# Patient Record
Sex: Female | Born: 1935
Health system: Southern US, Community
[De-identification: ages and names within clinical notes are randomized; demographics above are authoritative.]

## PROBLEM LIST (undated history)

## (undated) DIAGNOSIS — N179 Acute kidney failure, unspecified: Secondary | ICD-10-CM

## (undated) DIAGNOSIS — F0390 Unspecified dementia without behavioral disturbance: Secondary | ICD-10-CM

## (undated) DIAGNOSIS — I10 Essential (primary) hypertension: Secondary | ICD-10-CM

## (undated) DIAGNOSIS — E039 Hypothyroidism, unspecified: Secondary | ICD-10-CM

## (undated) DIAGNOSIS — E119 Type 2 diabetes mellitus without complications: Secondary | ICD-10-CM

## (undated) DIAGNOSIS — E079 Disorder of thyroid, unspecified: Secondary | ICD-10-CM

## (undated) HISTORY — DX: Essential (primary) hypertension: I10

## (undated) HISTORY — PX: ROTATOR CUFF REPAIR: SHX139

## (undated) HISTORY — DX: Acute kidney failure, unspecified: N17.9

## (undated) HISTORY — DX: Unspecified dementia, unspecified severity, without behavioral disturbance, psychotic disturbance, mood disturbance, and anxiety: F03.90

---

## 1999-09-11 ENCOUNTER — Encounter: Admission: RE | Admit: 1999-09-11 | Discharge: 1999-09-11 | Payer: Self-pay | Admitting: *Deleted

## 2000-09-12 ENCOUNTER — Encounter: Payer: Self-pay | Admitting: Internal Medicine

## 2000-09-12 ENCOUNTER — Encounter: Admission: RE | Admit: 2000-09-12 | Discharge: 2000-09-12 | Payer: Self-pay | Admitting: Internal Medicine

## 2000-10-15 ENCOUNTER — Other Ambulatory Visit: Admission: RE | Admit: 2000-10-15 | Discharge: 2000-10-15 | Payer: Self-pay | Admitting: Internal Medicine

## 2000-12-11 ENCOUNTER — Encounter: Admission: RE | Admit: 2000-12-11 | Discharge: 2000-12-11 | Payer: Self-pay

## 2001-05-20 ENCOUNTER — Encounter: Admission: RE | Admit: 2001-05-20 | Discharge: 2001-05-20 | Payer: Self-pay

## 2001-09-16 ENCOUNTER — Encounter: Admission: RE | Admit: 2001-09-16 | Discharge: 2001-09-16 | Payer: Self-pay

## 2001-10-08 ENCOUNTER — Encounter: Admission: RE | Admit: 2001-10-08 | Discharge: 2001-10-08 | Payer: Self-pay

## 2001-10-22 ENCOUNTER — Encounter: Admission: RE | Admit: 2001-10-22 | Discharge: 2001-10-22 | Payer: Self-pay

## 2001-11-05 ENCOUNTER — Encounter: Admission: RE | Admit: 2001-11-05 | Discharge: 2001-11-05 | Payer: Self-pay

## 2001-11-13 ENCOUNTER — Ambulatory Visit (HOSPITAL_COMMUNITY): Admission: RE | Admit: 2001-11-13 | Discharge: 2001-11-13 | Payer: Self-pay | Admitting: Obstetrics and Gynecology

## 2001-11-13 ENCOUNTER — Encounter (INDEPENDENT_AMBULATORY_CARE_PROVIDER_SITE_OTHER): Payer: Self-pay | Admitting: *Deleted

## 2002-09-16 ENCOUNTER — Encounter: Admission: RE | Admit: 2002-09-16 | Discharge: 2002-09-16 | Payer: Self-pay

## 2003-01-12 ENCOUNTER — Encounter: Payer: Self-pay | Admitting: Orthopedic Surgery

## 2003-01-14 ENCOUNTER — Observation Stay (HOSPITAL_COMMUNITY): Admission: RE | Admit: 2003-01-14 | Discharge: 2003-01-15 | Payer: Self-pay | Admitting: Orthopedic Surgery

## 2003-04-07 ENCOUNTER — Ambulatory Visit (HOSPITAL_COMMUNITY): Admission: RE | Admit: 2003-04-07 | Discharge: 2003-04-07 | Payer: Self-pay | Admitting: *Deleted

## 2003-09-20 ENCOUNTER — Encounter: Admission: RE | Admit: 2003-09-20 | Discharge: 2003-09-20 | Payer: Self-pay | Admitting: Internal Medicine

## 2003-12-09 ENCOUNTER — Other Ambulatory Visit: Admission: RE | Admit: 2003-12-09 | Discharge: 2003-12-09 | Payer: Self-pay | Admitting: Internal Medicine

## 2004-09-20 ENCOUNTER — Ambulatory Visit (HOSPITAL_COMMUNITY): Admission: RE | Admit: 2004-09-20 | Discharge: 2004-09-20 | Payer: Self-pay | Admitting: Internal Medicine

## 2005-05-08 ENCOUNTER — Encounter: Admission: RE | Admit: 2005-05-08 | Discharge: 2005-05-08 | Payer: Self-pay

## 2005-05-23 ENCOUNTER — Encounter: Admission: RE | Admit: 2005-05-23 | Discharge: 2005-05-23 | Payer: Self-pay

## 2005-06-07 ENCOUNTER — Encounter: Admission: RE | Admit: 2005-06-07 | Discharge: 2005-06-07 | Payer: Self-pay

## 2005-06-27 ENCOUNTER — Encounter: Admission: RE | Admit: 2005-06-27 | Discharge: 2005-06-27 | Payer: Self-pay

## 2005-09-26 ENCOUNTER — Ambulatory Visit (HOSPITAL_COMMUNITY): Admission: RE | Admit: 2005-09-26 | Discharge: 2005-09-26 | Payer: Self-pay | Admitting: Internal Medicine

## 2006-09-30 ENCOUNTER — Ambulatory Visit (HOSPITAL_COMMUNITY): Admission: RE | Admit: 2006-09-30 | Discharge: 2006-09-30 | Payer: Self-pay | Admitting: Internal Medicine

## 2007-07-02 ENCOUNTER — Encounter: Admission: RE | Admit: 2007-07-02 | Discharge: 2007-07-02 | Payer: Self-pay | Admitting: Internal Medicine

## 2007-10-03 ENCOUNTER — Ambulatory Visit (HOSPITAL_COMMUNITY): Admission: RE | Admit: 2007-10-03 | Discharge: 2007-10-03 | Payer: Self-pay | Admitting: Internal Medicine

## 2007-10-31 ENCOUNTER — Encounter: Admission: RE | Admit: 2007-10-31 | Discharge: 2007-10-31 | Payer: Self-pay

## 2008-10-04 ENCOUNTER — Ambulatory Visit (HOSPITAL_COMMUNITY): Admission: RE | Admit: 2008-10-04 | Discharge: 2008-10-04 | Payer: Self-pay | Admitting: Internal Medicine

## 2009-07-07 ENCOUNTER — Encounter: Admission: RE | Admit: 2009-07-07 | Discharge: 2009-07-07 | Payer: Self-pay | Admitting: Urology

## 2009-07-11 ENCOUNTER — Ambulatory Visit (HOSPITAL_BASED_OUTPATIENT_CLINIC_OR_DEPARTMENT_OTHER): Admission: RE | Admit: 2009-07-11 | Discharge: 2009-07-12 | Payer: Self-pay | Admitting: Urology

## 2009-10-05 ENCOUNTER — Ambulatory Visit (HOSPITAL_COMMUNITY): Admission: RE | Admit: 2009-10-05 | Discharge: 2009-10-05 | Payer: Self-pay | Admitting: Internal Medicine

## 2010-10-09 ENCOUNTER — Ambulatory Visit (HOSPITAL_COMMUNITY)
Admission: RE | Admit: 2010-10-09 | Discharge: 2010-10-09 | Payer: Self-pay | Source: Home / Self Care | Attending: Internal Medicine | Admitting: Internal Medicine

## 2010-12-28 LAB — BASIC METABOLIC PANEL
BUN: 29 mg/dL — ABNORMAL HIGH (ref 6–23)
CO2: 30 mEq/L (ref 19–32)
Calcium: 9.1 mg/dL (ref 8.4–10.5)
Chloride: 98 mEq/L (ref 96–112)
Creatinine, Ser: 0.97 mg/dL (ref 0.4–1.2)
GFR calc Af Amer: >60 mL/min (ref >60)
GFR calc non Af Amer: 56 mL/min — ABNORMAL LOW (ref >60)
Glucose, Bld: 78 mg/dL (ref 70–99)
Potassium: 4.1 mEq/L (ref 3.5–5.1)
Sodium: 138 mEq/L (ref 135–145)

## 2011-02-09 NOTE — H&P (Signed)
[facility]  Patient:    Brianna Carr, Brianna Carr Visit Number: [id number] MRN: [id number]          Service Type: Attending:  Juluis Mire, M.D. Dictated by:   Juluis Mire, M.D. Adm. Date:  11/13/01                           History and Physical  CHIEF COMPLAINT:              The patient is a 78 year old gravida 6 para 5 abortus 1, pleasant female who presents for hysteroscopic evaluation.  HISTORY OF PRESENT ILLNESS:   In relation to the present admission, the patient was initially presented to our practice on September 23, 2001 for evaluation of possible pelvic relaxation.  She stated she had some urinary frequency.  On evaluation it was found that she did have a cystocele and rectocele.  The uterine lining was somewhat atrophic.  She had fairly good uterine support and unremarkable bimanual examination.  Because of the pelvic relaxation we went ahead and did an ultrasound evaluation and overall uterine size was normal but she had a significant thickening of the endometrium, with an apparent polyp that was identified upon ultrasound.  In view of this the patient now presents for hysteroscopic evaluation.  Of note, she denies any abnormal bleeding.  ALLERGIES:                    1. SULFA.                               2. MACRODANTIN.  MEDICATIONS:                  1. Synthroid.                               2. Ziac.                               3. Vioxx.  PAST MEDICAL HISTORY:         1. History of hypertension.  She is under active                                  management for this.  She sees Dr. Shelva Majestic for                                  routine physicals.                               2. History of osteoarthritis, for which she sees                                  Dr. Phylliss Bob.  She has had a recent spinal                                  injection for pain associated with that.  3. Hypothyroid, on Synthroid as  noted; fairly                                  good control.  PAST SURGICAL HISTORY:        1. Previous bilateral tubal ligation.                               2. Previous carpal tunnel surgery.  PAST OBSTETRICAL HISTORY:     Five spontaneous vaginal deliveries.  SOCIAL HISTORY:               No tobacco or alcohol use.  FAMILY HISTORY:               Noncontributory.  REVIEW OF SYSTEMS:            Noncontributory.  PHYSICAL EXAMINATION:  VITAL SIGNS:                  The patient is afebrile with stable vital signs.  HEENT:                        Normocephalic.  PERRLA.  EOMI.  Sclerae and conjunctivae clear.  Oropharynx clear.  NECK:                         Without thyromegaly.  BREAST:                       No discrete masses.  LUNGS:                        Clear.  CARDIAC:                      Regular rate and rhythm without murmurs or gallops.  ABDOMEN:                      Benign.  PELVIC:                       Normal external genitalia, vaginal mucosa.  Mild to moderate cystocele and rectocele.  Uterus normal size, shape, and contour. Adnexa free of masses or tenderness.  RECTAL:                       Clear.  EXTREMITIES:                  Trace edema.  NEUROLOGIC:                   Grossly within normal limits.  IMPRESSION:                   1. Abnormal endometrial buildup.                               2. Pelvic relaxation.                               3. Hypertension.  4. Hypothyroidism.                               5. Osteoarthritis.  PLAN:                         The patient will undergo hysteroscopic evaluation for evaluation of the uterine lining.  The risks of surgery have been discussed including anesthetic concerns, the risk of infection, the rare risk of vascular injury that could require transfusion - with the risk of AIDS or hepatitis, excessive bleeding that could require hysterectomy, risk of uterine perforation  that could lead to injury to adjacent organs requiring further exploratory surgery, risk of deep vein thrombosis and pulmonary embolus.  Rare complications of hysteroscopy include fluid overload leading to pulmonary edema or hyponatremia.  The patient expressed understanding of indications and risks. Dictated by:   Juluis Mire, M.D. Attending:  Juluis Mire, M.D. DD:  11/13/01 TD:  11/13/01 Job: [id number]

## 2011-02-09 NOTE — Op Note (Signed)
   NAMEDONNAE, MICHELS                        ACCOUNT NO.:  [id number]   MEDICAL RECORD NO.:  [id number]                   PATIENT TYPE:  AMB   LOCATION:  ENDO                                 FACILITY:  [facility]   PHYSICIAN:  Georgiana Spinner, M.D.                 DATE OF BIRTH:  [birth year]   DATE OF PROCEDURE:  04/07/2003  DATE OF DISCHARGE:                                 OPERATIVE REPORT   PROCEDURE PERFORMED:  Colonoscopy.   ENDOSCOPIST:  Georgiana Spinner, M.D.   INDICATIONS FOR PROCEDURE:  Screening for colon cancer.   ANESTHESIA:  Demerol 80 mg, Versed 7 mg.   DESCRIPTION OF PROCEDURE:  With the patient mildly sedated in the left  lateral decubitus position, the Olympus video colonoscope was inserted in  the rectum and passed under direct vision to the cecum, identified by the  crow's foot of the cecum and the ileocecal valve, both of which were  photographed.  From this point the colonoscope was slowly withdrawn taking  circumferential views of the entire colonic mucosa stopping only in the  rectum which appeared normal on direct and showed hemorrhoids on retroflex  view.  The endoscope was straightened and withdrawn.  The patient's vital  signs and pulse oximeter remained stable.  The patient tolerated the  procedure well without apparent complications.   FINDINGS:  Internal hemorrhoids.  Otherwise unremarkable examination.   PLAN:  Repeat examination possibly in five to 10 years.                                                 Georgiana Spinner, M.D.    GMO/MEDQ  D:  04/07/2003  T:  04/07/2003  Job:  [id number]

## 2011-02-09 NOTE — Op Note (Signed)
[facility]  Patient:    KRIMSON, MASSMANN Visit Number: [id number] MRN: [id number]          Service Type: Attending:  Juluis Mire, M.D. Dictated by:   Juluis Mire, M.D. Proc. Date: 11/13/01                             Operative Report  PREOPERATIVE DIAGNOSIS:       Probable endometrial polyp.  POSTOPERATIVE DIAGNOSIS:      Probable endometrial polyp.  OPERATION:                    1. Cervical dilatation.                               2. Hysteroscopy.                               3. Resection of large endometrial polyp.                               4. Multiple endometrial biopsies.                               5. Uterine curettings.  SURGEON:                      Juluis Mire, M.D.  ANESTHESIA:                   General.  ESTIMATED BLOOD LOSS:         Minimal.  PACKS AND DRAINS:             None.  INTRAOPERATIVE BLOOD REPLACEMENT:                  None.  COMPLICATIONS:                None.  INDICATIONS:                  Are notated in the History and Physical.  DESCRIPTION OF PROCEDURE:     The patient was taken to the OR and placed in the supine position.  After a satisfactory level of general anesthesia was obtained, the patient was placed in the dorsolithotomy position using Allen stirrups.  Perineum and vagina were prepped out with Betadine.  The patient was draped out for hysteroscopy.  A speculum was placed in the vaginal vault. The cervix was grasped with a single tooth tenaculum.  The uterus sounded to approximately 8 cm.  The cervix was serially dilated to a size 35 Pratt dilator.  The operative hysteroscope was then introduced.  The intrauterine cavity was distended using sorbitol.  Visualization revealed a large endometrial polyp.  This was removed with multiple resections and sent for pathological review.  We then visualized the endometrial cavity once the polyp was removed.  The _________ was hemostatically  intact.  Otherwise, the endometrium was very atrophic.  Multiple endometrial biopsies were obtained from anterior, posterior, and lateral walls.  These were all sent for pathological view. Endometrial curettings were also obtained.  These were minimal in nature. There are no signs of perforation or active bleeding.  The speculum  and single tooth tenaculum were then removed.  The patient was taken out of the dorsolithotomy position, was alert, extubated, and transferred to the recovery room in good condition.  Sponge, instrument, and needle count was reported as correct by the circulating nurse. Dictated by:   Juluis Mire, M.D. Attending:  Juluis Mire, M.D. DD:  11/13/01 TD:  11/14/01 Job: [id number]

## 2011-02-09 NOTE — Op Note (Signed)
[name], Brianna Carr                        ACCOUNT NO.:  [id number]   MEDICAL RECORD NO.:  [id number]                   PATIENT TYPE:  OBV   LOCATION:  0464                                 FACILITY:  [facility]   PHYSICIAN:  Georges Lynch. Darrelyn Carr, M.D.             DATE OF BIRTH:  1931/04/13   DATE OF PROCEDURE:  01/14/2003  DATE OF DISCHARGE:                                 OPERATIVE REPORT   SURGEON:  Georges Lynch. Darrelyn Carr, M.D.   ASSISTANT:  Ebbie Ridge. Paitsel, P.A.   PREOPERATIVE DIAGNOSES:  1. Frozen shoulder, right shoulder.  2. Severe impingement syndrome, right shoulder with a partial tear of the     rotator cuff.   POSTOPERATIVE DIAGNOSES:  1. Frozen shoulder, right shoulder.  2. Severe impingement syndrome, right shoulder with a partial tear of the     rotator cuff.   OPERATION:  1. Closed manipulation of the right shoulder under general anesthesia.  2. Open decompression of the right shoulder.  3. Excision of the subdeltoid bursa, right shoulder.  4. Exploratory laparotomy of the rotator cuff, right shoulder.   DESCRIPTION OF PROCEDURE:  Under general anesthesia, a routine orthopedic  prep and draping of the right shoulder was carried out.  At this time, the  patient underwent 1 g of IV Ancef.  An incision was made over the anterior  aspect of the right shoulder, bleeders identified and cauterized.  Following  this, I stripped the deltoid tendon from the acromion in the usual fashion  and then partially split the proximal portion of the deltoid muscle.  Note,  she had severe impingement syndrome.  I protected the cuff with the Bennett  retractor and then utilized the oscillating saw to do a partial  acromionectomy and acromioplasty utilizing the bur.  Following this, I  thoroughly irrigated out the area.  I removed the subdeltoid bursa and then  inspected the rotator cuff.  The rotator cuff did not show a frank tear that  needed repair but was markedly thinned.  I did not  feel that any repair or  graft was necessary.  I took the shoulder Thursday the entire range of  motion and noted there were no other abnormalities.  I then thoroughly  irrigated out the area, reattached the deltoid tendon and muscle in the  usual fashion, then closed the subcu with 0 Vicryl, skin with metal staples,  and she was placed in a shoulder immobilizer.  Note, prior to the surgery, I  did do a closed manipulation of her frozen shoulder.  Then I injected about  20 mL of 0.5% Marcaine with epinephrine into the shoulder joint.  That was  all done before applying the dressing, obviously.  Brianna Carr, M.D.    RAG/MEDQ  D:  01/14/2003  T:  01/14/2003  Job:  [id number]

## 2011-09-10 ENCOUNTER — Other Ambulatory Visit (HOSPITAL_COMMUNITY): Payer: Self-pay | Admitting: Internal Medicine

## 2011-09-10 DIAGNOSIS — Z1231 Encounter for screening mammogram for malignant neoplasm of breast: Secondary | ICD-10-CM

## 2011-10-15 ENCOUNTER — Ambulatory Visit (HOSPITAL_COMMUNITY)
Admission: RE | Admit: 2011-10-15 | Discharge: 2011-10-15 | Disposition: A | Discharge status: Home / Self Care | Payer: Medicare Other | Source: Ambulatory Visit | Attending: Internal Medicine | Admitting: Internal Medicine

## 2011-10-15 DIAGNOSIS — Z1231 Encounter for screening mammogram for malignant neoplasm of breast: Secondary | ICD-10-CM | POA: Insufficient documentation

## 2012-01-28 ENCOUNTER — Other Ambulatory Visit: Payer: Self-pay | Admitting: Internal Medicine

## 2012-01-28 DIAGNOSIS — M4800 Spinal stenosis, site unspecified: Secondary | ICD-10-CM

## 2012-02-01 ENCOUNTER — Other Ambulatory Visit: Payer: Self-pay

## 2012-02-01 ENCOUNTER — Ambulatory Visit
Admission: RE | Admit: 2012-02-01 | Discharge: 2012-02-01 | Disposition: A | Discharge status: Home / Self Care | Payer: Medicare Other | Source: Ambulatory Visit | Attending: Internal Medicine | Admitting: Internal Medicine

## 2012-02-01 DIAGNOSIS — M4800 Spinal stenosis, site unspecified: Secondary | ICD-10-CM

## 2012-08-19 ENCOUNTER — Other Ambulatory Visit: Payer: Self-pay | Admitting: Dermatology

## 2012-09-26 ENCOUNTER — Other Ambulatory Visit (HOSPITAL_COMMUNITY): Payer: Self-pay | Admitting: Internal Medicine

## 2012-09-26 DIAGNOSIS — Z1231 Encounter for screening mammogram for malignant neoplasm of breast: Secondary | ICD-10-CM

## 2012-10-15 ENCOUNTER — Ambulatory Visit (HOSPITAL_COMMUNITY)
Admission: RE | Admit: 2012-10-15 | Discharge: 2012-10-15 | Disposition: A | Discharge status: Home / Self Care | Payer: Medicare Other | Source: Ambulatory Visit | Attending: Internal Medicine | Admitting: Internal Medicine

## 2012-10-15 DIAGNOSIS — Z1231 Encounter for screening mammogram for malignant neoplasm of breast: Secondary | ICD-10-CM | POA: Insufficient documentation

## 2012-10-17 ENCOUNTER — Other Ambulatory Visit: Payer: Self-pay | Admitting: Internal Medicine

## 2012-10-17 DIAGNOSIS — R928 Other abnormal and inconclusive findings on diagnostic imaging of breast: Secondary | ICD-10-CM

## 2012-10-30 ENCOUNTER — Ambulatory Visit
Admission: RE | Admit: 2012-10-30 | Discharge: 2012-10-30 | Disposition: A | Discharge status: Home / Self Care | Payer: Medicare Other | Source: Ambulatory Visit | Attending: Internal Medicine | Admitting: Internal Medicine

## 2012-10-30 DIAGNOSIS — R928 Other abnormal and inconclusive findings on diagnostic imaging of breast: Secondary | ICD-10-CM

## 2013-05-06 ENCOUNTER — Other Ambulatory Visit: Payer: Self-pay | Admitting: Dermatology

## 2013-08-26 ENCOUNTER — Other Ambulatory Visit (HOSPITAL_COMMUNITY): Payer: Self-pay | Admitting: Internal Medicine

## 2013-08-26 DIAGNOSIS — Z1231 Encounter for screening mammogram for malignant neoplasm of breast: Secondary | ICD-10-CM

## 2013-10-19 ENCOUNTER — Ambulatory Visit (HOSPITAL_COMMUNITY)
Admission: RE | Admit: 2013-10-19 | Discharge: 2013-10-19 | Disposition: A | Discharge status: Home / Self Care | Payer: Medicare HMO | Source: Ambulatory Visit | Attending: Internal Medicine | Admitting: Internal Medicine

## 2013-10-19 DIAGNOSIS — Z1231 Encounter for screening mammogram for malignant neoplasm of breast: Secondary | ICD-10-CM | POA: Insufficient documentation

## 2014-10-21 ENCOUNTER — Other Ambulatory Visit (HOSPITAL_COMMUNITY): Payer: Self-pay | Admitting: Internal Medicine

## 2014-10-21 DIAGNOSIS — Z1231 Encounter for screening mammogram for malignant neoplasm of breast: Secondary | ICD-10-CM

## 2014-10-25 ENCOUNTER — Ambulatory Visit (HOSPITAL_COMMUNITY)
Admission: RE | Admit: 2014-10-25 | Discharge: 2014-10-25 | Disposition: A | Discharge status: Home / Self Care | Payer: Medicare HMO | Source: Ambulatory Visit | Attending: Internal Medicine | Admitting: Internal Medicine

## 2014-10-25 DIAGNOSIS — Z1231 Encounter for screening mammogram for malignant neoplasm of breast: Secondary | ICD-10-CM | POA: Insufficient documentation

## 2015-02-16 ENCOUNTER — Other Ambulatory Visit: Payer: Self-pay | Admitting: Internal Medicine

## 2015-02-16 DIAGNOSIS — M549 Dorsalgia, unspecified: Secondary | ICD-10-CM

## 2015-02-22 ENCOUNTER — Ambulatory Visit
Admission: RE | Admit: 2015-02-22 | Discharge: 2015-02-22 | Disposition: A | Discharge status: Home / Self Care | Payer: Medicare HMO | Source: Ambulatory Visit | Attending: Internal Medicine | Admitting: Internal Medicine

## 2015-02-22 DIAGNOSIS — M549 Dorsalgia, unspecified: Secondary | ICD-10-CM

## 2015-07-02 DIAGNOSIS — Z23 Encounter for immunization: Secondary | ICD-10-CM | POA: Diagnosis not present

## 2015-08-12 DIAGNOSIS — R7309 Other abnormal glucose: Secondary | ICD-10-CM | POA: Diagnosis not present

## 2015-08-12 DIAGNOSIS — R7989 Other specified abnormal findings of blood chemistry: Secondary | ICD-10-CM | POA: Diagnosis not present

## 2015-08-12 DIAGNOSIS — E7800 Pure hypercholesterolemia, unspecified: Secondary | ICD-10-CM | POA: Diagnosis not present

## 2015-08-17 DIAGNOSIS — I10 Essential (primary) hypertension: Secondary | ICD-10-CM | POA: Diagnosis not present

## 2015-08-17 DIAGNOSIS — E039 Hypothyroidism, unspecified: Secondary | ICD-10-CM | POA: Diagnosis not present

## 2015-08-17 DIAGNOSIS — E7800 Pure hypercholesterolemia, unspecified: Secondary | ICD-10-CM | POA: Diagnosis not present

## 2015-08-17 DIAGNOSIS — E119 Type 2 diabetes mellitus without complications: Secondary | ICD-10-CM | POA: Diagnosis not present

## 2015-09-06 DIAGNOSIS — R69 Illness, unspecified: Secondary | ICD-10-CM | POA: Diagnosis not present

## 2015-10-10 ENCOUNTER — Other Ambulatory Visit: Payer: Self-pay

## 2015-10-10 DIAGNOSIS — Z1231 Encounter for screening mammogram for malignant neoplasm of breast: Secondary | ICD-10-CM

## 2015-11-01 ENCOUNTER — Ambulatory Visit
Admission: RE | Admit: 2015-11-01 | Discharge: 2015-11-01 | Disposition: A | Discharge status: Home / Self Care | Payer: Medicare HMO | Source: Ambulatory Visit

## 2015-11-01 DIAGNOSIS — Z1231 Encounter for screening mammogram for malignant neoplasm of breast: Secondary | ICD-10-CM

## 2015-12-12 DIAGNOSIS — E039 Hypothyroidism, unspecified: Secondary | ICD-10-CM | POA: Diagnosis not present

## 2015-12-12 DIAGNOSIS — E119 Type 2 diabetes mellitus without complications: Secondary | ICD-10-CM | POA: Diagnosis not present

## 2015-12-12 DIAGNOSIS — I10 Essential (primary) hypertension: Secondary | ICD-10-CM | POA: Diagnosis not present

## 2015-12-27 DIAGNOSIS — E7800 Pure hypercholesterolemia, unspecified: Secondary | ICD-10-CM | POA: Diagnosis not present

## 2015-12-27 DIAGNOSIS — E119 Type 2 diabetes mellitus without complications: Secondary | ICD-10-CM | POA: Diagnosis not present

## 2015-12-27 DIAGNOSIS — I10 Essential (primary) hypertension: Secondary | ICD-10-CM | POA: Diagnosis not present

## 2015-12-27 DIAGNOSIS — E039 Hypothyroidism, unspecified: Secondary | ICD-10-CM | POA: Diagnosis not present

## 2016-02-17 DIAGNOSIS — L820 Inflamed seborrheic keratosis: Secondary | ICD-10-CM | POA: Diagnosis not present

## 2016-02-17 DIAGNOSIS — L570 Actinic keratosis: Secondary | ICD-10-CM | POA: Diagnosis not present

## 2016-02-17 DIAGNOSIS — I781 Nevus, non-neoplastic: Secondary | ICD-10-CM | POA: Diagnosis not present

## 2016-02-17 DIAGNOSIS — D2272 Melanocytic nevi of left lower limb, including hip: Secondary | ICD-10-CM | POA: Diagnosis not present

## 2016-02-17 DIAGNOSIS — L821 Other seborrheic keratosis: Secondary | ICD-10-CM | POA: Diagnosis not present

## 2016-02-17 DIAGNOSIS — L304 Erythema intertrigo: Secondary | ICD-10-CM | POA: Diagnosis not present

## 2016-03-07 DIAGNOSIS — R69 Illness, unspecified: Secondary | ICD-10-CM | POA: Diagnosis not present

## 2016-04-09 DIAGNOSIS — E039 Hypothyroidism, unspecified: Secondary | ICD-10-CM | POA: Diagnosis not present

## 2016-04-09 DIAGNOSIS — E119 Type 2 diabetes mellitus without complications: Secondary | ICD-10-CM | POA: Diagnosis not present

## 2016-04-09 DIAGNOSIS — I10 Essential (primary) hypertension: Secondary | ICD-10-CM | POA: Diagnosis not present

## 2016-04-18 DIAGNOSIS — E119 Type 2 diabetes mellitus without complications: Secondary | ICD-10-CM | POA: Diagnosis not present

## 2016-04-18 DIAGNOSIS — E7800 Pure hypercholesterolemia, unspecified: Secondary | ICD-10-CM | POA: Diagnosis not present

## 2016-04-18 DIAGNOSIS — E039 Hypothyroidism, unspecified: Secondary | ICD-10-CM | POA: Diagnosis not present

## 2016-04-18 DIAGNOSIS — I10 Essential (primary) hypertension: Secondary | ICD-10-CM | POA: Diagnosis not present

## 2016-07-05 DIAGNOSIS — M25522 Pain in left elbow: Secondary | ICD-10-CM | POA: Diagnosis not present

## 2016-07-12 DIAGNOSIS — E039 Hypothyroidism, unspecified: Secondary | ICD-10-CM | POA: Diagnosis not present

## 2016-07-12 DIAGNOSIS — E7800 Pure hypercholesterolemia, unspecified: Secondary | ICD-10-CM | POA: Diagnosis not present

## 2016-07-12 DIAGNOSIS — E119 Type 2 diabetes mellitus without complications: Secondary | ICD-10-CM | POA: Diagnosis not present

## 2016-07-18 DIAGNOSIS — M25522 Pain in left elbow: Secondary | ICD-10-CM | POA: Diagnosis not present

## 2016-07-18 DIAGNOSIS — G5622 Lesion of ulnar nerve, left upper limb: Secondary | ICD-10-CM | POA: Diagnosis not present

## 2016-07-18 DIAGNOSIS — M47812 Spondylosis without myelopathy or radiculopathy, cervical region: Secondary | ICD-10-CM | POA: Diagnosis not present

## 2016-07-18 DIAGNOSIS — R52 Pain, unspecified: Secondary | ICD-10-CM | POA: Diagnosis not present

## 2016-07-18 DIAGNOSIS — M79642 Pain in left hand: Secondary | ICD-10-CM | POA: Diagnosis not present

## 2016-07-24 DIAGNOSIS — G5622 Lesion of ulnar nerve, left upper limb: Secondary | ICD-10-CM | POA: Diagnosis not present

## 2016-07-24 DIAGNOSIS — G5612 Other lesions of median nerve, left upper limb: Secondary | ICD-10-CM | POA: Diagnosis not present

## 2016-07-27 DIAGNOSIS — G5602 Carpal tunnel syndrome, left upper limb: Secondary | ICD-10-CM | POA: Diagnosis not present

## 2016-07-30 DIAGNOSIS — E119 Type 2 diabetes mellitus without complications: Secondary | ICD-10-CM | POA: Diagnosis not present

## 2016-07-30 DIAGNOSIS — I10 Essential (primary) hypertension: Secondary | ICD-10-CM | POA: Diagnosis not present

## 2016-07-30 DIAGNOSIS — E039 Hypothyroidism, unspecified: Secondary | ICD-10-CM | POA: Diagnosis not present

## 2016-08-02 DIAGNOSIS — R69 Illness, unspecified: Secondary | ICD-10-CM | POA: Diagnosis not present

## 2016-08-02 DIAGNOSIS — N390 Urinary tract infection, site not specified: Secondary | ICD-10-CM | POA: Diagnosis not present

## 2016-08-09 DIAGNOSIS — Z23 Encounter for immunization: Secondary | ICD-10-CM | POA: Diagnosis not present

## 2016-08-09 DIAGNOSIS — B354 Tinea corporis: Secondary | ICD-10-CM | POA: Diagnosis not present

## 2016-08-20 DIAGNOSIS — G5602 Carpal tunnel syndrome, left upper limb: Secondary | ICD-10-CM | POA: Diagnosis not present

## 2016-08-23 DIAGNOSIS — N390 Urinary tract infection, site not specified: Secondary | ICD-10-CM | POA: Diagnosis not present

## 2016-09-04 ENCOUNTER — Ambulatory Visit (INDEPENDENT_AMBULATORY_CARE_PROVIDER_SITE_OTHER): Payer: Medicare HMO | Admitting: Orthopaedic Surgery

## 2016-09-11 DIAGNOSIS — R69 Illness, unspecified: Secondary | ICD-10-CM | POA: Diagnosis not present

## 2016-09-13 DIAGNOSIS — E039 Hypothyroidism, unspecified: Secondary | ICD-10-CM | POA: Diagnosis not present

## 2016-09-13 DIAGNOSIS — M0570 Rheumatoid arthritis with rheumatoid factor of unspecified site without organ or systems involvement: Secondary | ICD-10-CM | POA: Diagnosis not present

## 2016-09-13 DIAGNOSIS — I10 Essential (primary) hypertension: Secondary | ICD-10-CM | POA: Diagnosis not present

## 2016-09-13 DIAGNOSIS — Z0000 Encounter for general adult medical examination without abnormal findings: Secondary | ICD-10-CM | POA: Diagnosis not present

## 2016-09-13 DIAGNOSIS — Z6829 Body mass index (BMI) 29.0-29.9, adult: Secondary | ICD-10-CM | POA: Diagnosis not present

## 2016-09-13 DIAGNOSIS — E119 Type 2 diabetes mellitus without complications: Secondary | ICD-10-CM | POA: Diagnosis not present

## 2016-09-26 DIAGNOSIS — L821 Other seborrheic keratosis: Secondary | ICD-10-CM | POA: Diagnosis not present

## 2016-09-26 DIAGNOSIS — L570 Actinic keratosis: Secondary | ICD-10-CM | POA: Diagnosis not present

## 2016-09-26 DIAGNOSIS — I781 Nevus, non-neoplastic: Secondary | ICD-10-CM | POA: Diagnosis not present

## 2016-09-26 DIAGNOSIS — Z23 Encounter for immunization: Secondary | ICD-10-CM | POA: Diagnosis not present

## 2016-10-18 ENCOUNTER — Other Ambulatory Visit: Payer: Self-pay | Admitting: Internal Medicine

## 2016-10-18 DIAGNOSIS — Z1231 Encounter for screening mammogram for malignant neoplasm of breast: Secondary | ICD-10-CM

## 2016-11-27 ENCOUNTER — Ambulatory Visit
Admission: RE | Admit: 2016-11-27 | Discharge: 2016-11-27 | Disposition: A | Discharge status: Home / Self Care | Payer: Medicare HMO | Source: Ambulatory Visit | Attending: Internal Medicine | Admitting: Internal Medicine

## 2016-11-27 DIAGNOSIS — Z1231 Encounter for screening mammogram for malignant neoplasm of breast: Secondary | ICD-10-CM

## 2016-12-20 DIAGNOSIS — A499 Bacterial infection, unspecified: Secondary | ICD-10-CM | POA: Diagnosis not present

## 2016-12-20 DIAGNOSIS — N390 Urinary tract infection, site not specified: Secondary | ICD-10-CM | POA: Diagnosis not present

## 2016-12-31 DIAGNOSIS — R69 Illness, unspecified: Secondary | ICD-10-CM | POA: Diagnosis not present

## 2017-01-08 DIAGNOSIS — L570 Actinic keratosis: Secondary | ICD-10-CM | POA: Diagnosis not present

## 2017-01-30 DIAGNOSIS — E119 Type 2 diabetes mellitus without complications: Secondary | ICD-10-CM | POA: Diagnosis not present

## 2017-01-30 DIAGNOSIS — E039 Hypothyroidism, unspecified: Secondary | ICD-10-CM | POA: Diagnosis not present

## 2017-01-30 DIAGNOSIS — N390 Urinary tract infection, site not specified: Secondary | ICD-10-CM | POA: Diagnosis not present

## 2017-01-30 DIAGNOSIS — I10 Essential (primary) hypertension: Secondary | ICD-10-CM | POA: Diagnosis not present

## 2017-02-07 DIAGNOSIS — E039 Hypothyroidism, unspecified: Secondary | ICD-10-CM | POA: Diagnosis not present

## 2017-02-07 DIAGNOSIS — R040 Epistaxis: Secondary | ICD-10-CM | POA: Diagnosis not present

## 2017-02-07 DIAGNOSIS — Z0000 Encounter for general adult medical examination without abnormal findings: Secondary | ICD-10-CM | POA: Diagnosis not present

## 2017-02-07 DIAGNOSIS — I10 Essential (primary) hypertension: Secondary | ICD-10-CM | POA: Diagnosis not present

## 2017-02-15 DIAGNOSIS — B359 Dermatophytosis, unspecified: Secondary | ICD-10-CM | POA: Diagnosis not present

## 2017-02-15 DIAGNOSIS — D2272 Melanocytic nevi of left lower limb, including hip: Secondary | ICD-10-CM | POA: Diagnosis not present

## 2017-02-15 DIAGNOSIS — L570 Actinic keratosis: Secondary | ICD-10-CM | POA: Diagnosis not present

## 2017-04-03 DIAGNOSIS — B359 Dermatophytosis, unspecified: Secondary | ICD-10-CM | POA: Diagnosis not present

## 2017-04-03 DIAGNOSIS — L309 Dermatitis, unspecified: Secondary | ICD-10-CM | POA: Diagnosis not present

## 2017-05-06 DIAGNOSIS — I10 Essential (primary) hypertension: Secondary | ICD-10-CM | POA: Diagnosis not present

## 2017-05-06 DIAGNOSIS — E039 Hypothyroidism, unspecified: Secondary | ICD-10-CM | POA: Diagnosis not present

## 2017-05-06 DIAGNOSIS — E119 Type 2 diabetes mellitus without complications: Secondary | ICD-10-CM | POA: Diagnosis not present

## 2017-05-10 DIAGNOSIS — E039 Hypothyroidism, unspecified: Secondary | ICD-10-CM | POA: Diagnosis not present

## 2017-05-10 DIAGNOSIS — E119 Type 2 diabetes mellitus without complications: Secondary | ICD-10-CM | POA: Diagnosis not present

## 2017-05-10 DIAGNOSIS — I10 Essential (primary) hypertension: Secondary | ICD-10-CM | POA: Diagnosis not present

## 2017-05-22 DIAGNOSIS — L309 Dermatitis, unspecified: Secondary | ICD-10-CM | POA: Diagnosis not present

## 2017-05-22 DIAGNOSIS — L570 Actinic keratosis: Secondary | ICD-10-CM | POA: Diagnosis not present

## 2017-06-04 DIAGNOSIS — R300 Dysuria: Secondary | ICD-10-CM | POA: Diagnosis not present

## 2017-06-04 DIAGNOSIS — N390 Urinary tract infection, site not specified: Secondary | ICD-10-CM | POA: Diagnosis not present

## 2017-06-24 DIAGNOSIS — R300 Dysuria: Secondary | ICD-10-CM | POA: Diagnosis not present

## 2017-06-24 DIAGNOSIS — N390 Urinary tract infection, site not specified: Secondary | ICD-10-CM | POA: Diagnosis not present

## 2017-06-24 DIAGNOSIS — M545 Low back pain: Secondary | ICD-10-CM | POA: Diagnosis not present

## 2017-07-09 DIAGNOSIS — Z23 Encounter for immunization: Secondary | ICD-10-CM | POA: Diagnosis not present

## 2017-07-25 DIAGNOSIS — 419620001 Death: Secondary | SNOMED CT | POA: Diagnosis not present

## 2017-07-25 DEATH — deceased

## 2017-08-01 DIAGNOSIS — Z0100 Encounter for examination of eyes and vision without abnormal findings: Secondary | ICD-10-CM | POA: Diagnosis not present

## 2017-08-01 DIAGNOSIS — I10 Essential (primary) hypertension: Secondary | ICD-10-CM | POA: Diagnosis not present

## 2017-10-08 ENCOUNTER — Other Ambulatory Visit: Payer: Self-pay | Admitting: Internal Medicine

## 2017-10-08 DIAGNOSIS — Z1231 Encounter for screening mammogram for malignant neoplasm of breast: Secondary | ICD-10-CM

## 2017-10-10 DIAGNOSIS — Z87891 Personal history of nicotine dependence: Secondary | ICD-10-CM | POA: Diagnosis not present

## 2017-10-10 DIAGNOSIS — Z7984 Long term (current) use of oral hypoglycemic drugs: Secondary | ICD-10-CM | POA: Diagnosis not present

## 2017-10-10 DIAGNOSIS — I10 Essential (primary) hypertension: Secondary | ICD-10-CM | POA: Diagnosis not present

## 2017-10-10 DIAGNOSIS — E11620 Type 2 diabetes mellitus with diabetic dermatitis: Secondary | ICD-10-CM | POA: Diagnosis not present

## 2017-10-10 DIAGNOSIS — R32 Unspecified urinary incontinence: Secondary | ICD-10-CM | POA: Diagnosis not present

## 2017-10-10 DIAGNOSIS — Z791 Long term (current) use of non-steroidal anti-inflammatories (NSAID): Secondary | ICD-10-CM | POA: Diagnosis not present

## 2017-10-10 DIAGNOSIS — E039 Hypothyroidism, unspecified: Secondary | ICD-10-CM | POA: Diagnosis not present

## 2017-10-10 DIAGNOSIS — E785 Hyperlipidemia, unspecified: Secondary | ICD-10-CM | POA: Diagnosis not present

## 2017-11-05 DIAGNOSIS — E119 Type 2 diabetes mellitus without complications: Secondary | ICD-10-CM | POA: Diagnosis not present

## 2017-11-05 DIAGNOSIS — E039 Hypothyroidism, unspecified: Secondary | ICD-10-CM | POA: Diagnosis not present

## 2017-11-05 DIAGNOSIS — I10 Essential (primary) hypertension: Secondary | ICD-10-CM | POA: Diagnosis not present

## 2017-11-15 DIAGNOSIS — E7800 Pure hypercholesterolemia, unspecified: Secondary | ICD-10-CM | POA: Diagnosis not present

## 2017-11-15 DIAGNOSIS — E119 Type 2 diabetes mellitus without complications: Secondary | ICD-10-CM | POA: Diagnosis not present

## 2017-11-15 DIAGNOSIS — Z780 Asymptomatic menopausal state: Secondary | ICD-10-CM | POA: Diagnosis not present

## 2017-11-15 DIAGNOSIS — R21 Rash and other nonspecific skin eruption: Secondary | ICD-10-CM | POA: Diagnosis not present

## 2017-11-15 DIAGNOSIS — I10 Essential (primary) hypertension: Secondary | ICD-10-CM | POA: Diagnosis not present

## 2017-11-15 DIAGNOSIS — E039 Hypothyroidism, unspecified: Secondary | ICD-10-CM | POA: Diagnosis not present

## 2017-11-28 ENCOUNTER — Ambulatory Visit
Admission: RE | Admit: 2017-11-28 | Discharge: 2017-11-28 | Disposition: A | Discharge status: Home / Self Care | Payer: Medicare HMO | Source: Ambulatory Visit | Attending: Internal Medicine | Admitting: Internal Medicine

## 2017-11-28 DIAGNOSIS — Z1231 Encounter for screening mammogram for malignant neoplasm of breast: Secondary | ICD-10-CM | POA: Diagnosis not present

## 2018-02-21 DIAGNOSIS — L570 Actinic keratosis: Secondary | ICD-10-CM | POA: Diagnosis not present

## 2018-02-21 DIAGNOSIS — L821 Other seborrheic keratosis: Secondary | ICD-10-CM | POA: Diagnosis not present

## 2018-02-21 DIAGNOSIS — L304 Erythema intertrigo: Secondary | ICD-10-CM | POA: Diagnosis not present

## 2018-02-21 DIAGNOSIS — L308 Other specified dermatitis: Secondary | ICD-10-CM | POA: Diagnosis not present

## 2018-02-21 DIAGNOSIS — D2272 Melanocytic nevi of left lower limb, including hip: Secondary | ICD-10-CM | POA: Diagnosis not present

## 2018-03-03 ENCOUNTER — Emergency Department (HOSPITAL_COMMUNITY): Payer: Medicare HMO

## 2018-03-03 ENCOUNTER — Encounter (HOSPITAL_COMMUNITY): Payer: Self-pay

## 2018-03-03 ENCOUNTER — Other Ambulatory Visit: Payer: Self-pay

## 2018-03-03 ENCOUNTER — Emergency Department (HOSPITAL_COMMUNITY)
Admission: EM | Admit: 2018-03-03 | Discharge: 2018-03-03 | Disposition: A | Discharge status: Home / Self Care | Payer: Medicare HMO | Attending: Emergency Medicine | Admitting: Emergency Medicine

## 2018-03-03 DIAGNOSIS — Y92018 Other place in single-family (private) house as the place of occurrence of the external cause: Secondary | ICD-10-CM | POA: Diagnosis not present

## 2018-03-03 DIAGNOSIS — Z23 Encounter for immunization: Secondary | ICD-10-CM | POA: Diagnosis not present

## 2018-03-03 DIAGNOSIS — M79642 Pain in left hand: Secondary | ICD-10-CM | POA: Diagnosis not present

## 2018-03-03 DIAGNOSIS — S022XXA Fracture of nasal bones, initial encounter for closed fracture: Secondary | ICD-10-CM | POA: Insufficient documentation

## 2018-03-03 DIAGNOSIS — S0003XA Contusion of scalp, initial encounter: Secondary | ICD-10-CM | POA: Insufficient documentation

## 2018-03-03 DIAGNOSIS — S0121XA Laceration without foreign body of nose, initial encounter: Secondary | ICD-10-CM | POA: Diagnosis not present

## 2018-03-03 DIAGNOSIS — Y999 Unspecified external cause status: Secondary | ICD-10-CM | POA: Diagnosis not present

## 2018-03-03 DIAGNOSIS — S0993XA Unspecified injury of face, initial encounter: Secondary | ICD-10-CM | POA: Diagnosis present

## 2018-03-03 DIAGNOSIS — Y9301 Activity, walking, marching and hiking: Secondary | ICD-10-CM | POA: Insufficient documentation

## 2018-03-03 DIAGNOSIS — M25532 Pain in left wrist: Secondary | ICD-10-CM

## 2018-03-03 DIAGNOSIS — W19XXXA Unspecified fall, initial encounter: Secondary | ICD-10-CM

## 2018-03-03 DIAGNOSIS — W0110XA Fall on same level from slipping, tripping and stumbling with subsequent striking against unspecified object, initial encounter: Secondary | ICD-10-CM | POA: Diagnosis not present

## 2018-03-03 DIAGNOSIS — S6992XA Unspecified injury of left wrist, hand and finger(s), initial encounter: Secondary | ICD-10-CM | POA: Diagnosis not present

## 2018-03-03 DIAGNOSIS — S60417A Abrasion of left little finger, initial encounter: Secondary | ICD-10-CM | POA: Insufficient documentation

## 2018-03-03 HISTORY — DX: Type 2 diabetes mellitus without complications: E11.9

## 2018-03-03 HISTORY — DX: Disorder of thyroid, unspecified: E07.9

## 2018-03-03 MED ORDER — TRAMADOL HCL 50 MG PO TABS: 50.0000 mg | ORAL_TABLET | Freq: Four times a day (QID) | ORAL | 0 refills | Status: DC | PRN

## 2018-03-03 MED ORDER — TETANUS-DIPHTH-ACELL PERTUSSIS 5-2.5-18.5 LF-MCG/0.5 IM SUSP
0.5000 mL | Freq: Once | INTRAMUSCULAR | Status: AC
  Administered 2018-03-03: 0.5 mL via INTRAMUSCULAR
  Filled 2018-03-03: qty 0.5

## 2018-03-03 NOTE — ED Provider Notes (Signed)
[facility] DEPT Provider Note   CSN: [id number] Arrival date & time: 03/03/18  1308     History   Chief Complaint Chief Complaint  Patient presents with  . Fall  . Head Injury    HPI Brianna Carr is a 84 y.o. female.  HPI  Brianna Carr is an 84yo female with a history of hypothyroidism who presents to the emergency department for evaluation after falling and hitting her head earlier today.  Patient states that Brianna Carr was walking in her driveway when Brianna Carr turned around to talk to her husband Brianna Carr accidentally lost her balance and fell forward hitting her head.  This happened around 12 PM today.  Brianna Carr denies loss of consciousness or blood thinner use.  Reports that Brianna Carr now has pain over her nose and forehead.  Reports pain is moderate and feels sore. Brianna Carr has not taken any over-the-counter medications for her symptoms.  Brianna Carr also reports left wrist and hand pain as well.  Brianna Carr denies nausea/vomiting, numbness, weakness, neck pain, back pain, chest pain, shortness of breath, abdominal pain, dysuria, urinary frequency.    Past Medical History:  Diagnosis Date  . Diabetes mellitus without complication (HCC)    borderline diabetes  . Thyroid disease     There are no active problems to display for this patient.   Past Surgical History:  Procedure Laterality Date  . ROTATOR CUFF REPAIR       OB History   None      Home Medications    Prior to Admission medications   Not on File    Family History History reviewed. No pertinent family history.  Social History Social History   Tobacco Use  . Smoking status: Never Smoker  . Smokeless tobacco: Never Used  Substance Use Topics  . Alcohol use: Never    Frequency: Never  . Drug use: Never     Allergies   Patient has no known allergies.   Review of Systems Review of Systems  Constitutional: Negative for fever.  HENT: Positive for facial swelling (scalp hematoma). Negative for  nosebleeds.   Eyes: Negative for visual disturbance.  Respiratory: Negative for shortness of breath.   Cardiovascular: Negative for chest pain.  Gastrointestinal: Negative for abdominal pain, nausea and vomiting.  Genitourinary: Negative for difficulty urinating, dysuria and frequency.  Musculoskeletal: Positive for arthralgias (left wrist). Negative for back pain, gait problem and neck pain.  Skin: Positive for color change (facial ecchymosis) and wound (small laceration over nasal bridge).  Neurological: Positive for headaches (pain over her face and nose).  Psychiatric/Behavioral: Negative for behavioral problems and confusion.     Physical Exam Updated Vital Signs BP (!) 161/77 (BP Location: Left Arm)   Pulse 66   Temp 98.1 F (36.7 C) (Oral)   Resp 15   Ht 5\' 3"  (1.6 m)   Wt 54.4 kg (120 lb)   SpO2 97%   BMI 21.26 kg/m   Physical Exam  Constitutional: Brianna Carr is oriented to person, place, and time. Brianna Carr appears well-developed and well-nourished. No distress.  Sitting at bedside in no apparent distress, nontoxic-appearing.  HENT:  Head: Normocephalic and atraumatic.  Mouth/Throat: Oropharynx is clear and moist. No oropharyngeal exudate.  Periorbital bruising noted over the left eye. No periorbital tenderness. Left scalp hematoma approximately 3cm in diameter. Approximately .5cm laceration noted on the bridge of the nose with overlying tenderness. No nasal septum hematoma. No battle sign. No hemotympanum.   Eyes: Pupils are equal, round, and  reactive to light. Conjunctivae and EOM are normal. Right eye exhibits no discharge. Left eye exhibits no discharge.  Neck: Normal range of motion. Neck supple.  No midline C-spine tenderness.  Cardiovascular: Normal rate, regular rhythm and intact distal pulses.  Pulmonary/Chest: Effort normal and breath sounds normal. No stridor. No respiratory distress. Brianna Carr has no wheezes. Brianna Carr has no rales.  Abdominal: Soft. Bowel sounds are normal. There  is no tenderness.  Musculoskeletal:  No midline T-spine or L-spine tenderness.  Abrasion noted over the ulnar aspect of the left little finger.  No surrounding erythema or warmth.  Left wrist with mild tenderness over the ulnar aspect as well as on the dorsal aspect of the hand. No swelling or obvious deformity. Full wrist flexion/extension, although patient reports tenderness.  No snuffbox tenderness.  Grip strength 5/5.  Radial pulses 2+ bilaterally.  Sensation to light touch intact in radial, median and ulnar nerve.  Neurological: Brianna Carr is alert and oriented to person, place, and time. Coordination normal.  Mental Status:  Alert, oriented, thought content appropriate, able to give a coherent history. Speech fluent without evidence of aphasia. Able to follow 2 step commands without difficulty.  Cranial Nerves:  II:  Peripheral visual fields grossly normal, pupils equal, round, reactive to light III,IV, VI: ptosis not present, extra-ocular motions intact bilaterally  V,VII: smile symmetric, facial light touch sensation equal VIII: hearing grossly normal to voice  X: uvula elevates symmetrically  XI: bilateral shoulder shrug symmetric and strong XII: midline tongue extension without fassiculations Motor:  Normal tone. 5/5 in upper and lower extremities bilaterally including strong and equal grip strength and dorsiflexion/plantar flexion Sensory: Pinprick and light touch normal in all extremities.  Gait: normal gait and balance  Skin: Skin is warm and dry. Capillary refill takes less than 2 seconds. Brianna Carr is not diaphoretic.  Psychiatric: Brianna Carr has a normal mood and affect. Her behavior is normal.  Nursing note and vitals reviewed.   ED Treatments / Results  Labs (all labs ordered are listed, but only abnormal results are displayed) Labs Reviewed - No data to display  EKG None  Radiology Ct Head Wo Contrast  Result Date: 03/03/2018 CLINICAL DATA:  Fall with facial hematoma. EXAM: CT HEAD  WITHOUT CONTRAST CT MAXILLOFACIAL WITHOUT CONTRAST TECHNIQUE: Multidetector CT imaging of the head and maxillofacial structures were performed using the standard protocol without intravenous contrast. Multiplanar CT image reconstructions of the maxillofacial structures were also generated. COMPARISON:  None. FINDINGS: CT HEAD FINDINGS Brain: There is no mass, hemorrhage or extra-axial collection. The size and configuration of the ventricles and extra-axial CSF spaces are normal. There is no acute or chronic infarction. The brain parenchyma is normal. Vascular: No hyperdense vessel or unexpected vascular calcification. Skull: Left frontal scalp hematoma without skull fracture. CT MAXILLOFACIAL FINDINGS Osseous: --Complex facial fracture types: No LeFort, zygomaticomaxillary complex or nasoorbitoethmoidal fracture. --Simple fracture types: Mildly displaced and comminuted fractures of the nasal bones. --Mandible, hard palate and teeth: No acute abnormality. Orbits: The globes are intact. Normal appearance of the intra- and extraconal fat. Symmetric extraocular muscles. Sinuses: No fluid levels or advanced mucosal thickening. Soft tissues: Soft tissue swelling adjacent to the nose. Left paramedian frontal scalp hematoma. IMPRESSION: 1. No acute intracranial abnormality. Normal appearance of the brain for age. 2. Left frontal scalp hematoma without skull fracture. 3. Comminuted and mildly displaced fractures of the nasal bones. Electronically Signed   By: Ulyses Jarred M.D.   On: 03/03/2018 15:45   Dg Hand Complete Left  Result Date: 03/03/2018 CLINICAL DATA:  84 year old female status post fall in driveway with skin tear and 5th metacarpal region pain. EXAM: LEFT HAND - COMPLETE 3+ VIEW COMPARISON:  None. FINDINGS: Diffuse moderate to severe IP joint osteoarthritis throughout the hand with pronounced joint space loss and bulky osteophytosis. The MCP joints are normal for age. Mild osteoarthritis at the 1st [facility]  joint. Mild for age radiocarpal joint space loss. No fracture or dislocation identified. The left 5th metacarpal appears intact. IMPRESSION: 1.  No acute fracture or dislocation identified about the left hand. 2. Osteoarthritis, severe at the IP joints. Electronically Signed   By: Genevie Ann M.D.   On: 03/03/2018 15:33   Ct Maxillofacial Wo Contrast  Result Date: 03/03/2018 CLINICAL DATA:  Fall with facial hematoma. EXAM: CT HEAD WITHOUT CONTRAST CT MAXILLOFACIAL WITHOUT CONTRAST TECHNIQUE: Multidetector CT imaging of the head and maxillofacial structures were performed using the standard protocol without intravenous contrast. Multiplanar CT image reconstructions of the maxillofacial structures were also generated. COMPARISON:  None. FINDINGS: CT HEAD FINDINGS Brain: There is no mass, hemorrhage or extra-axial collection. The size and configuration of the ventricles and extra-axial CSF spaces are normal. There is no acute or chronic infarction. The brain parenchyma is normal. Vascular: No hyperdense vessel or unexpected vascular calcification. Skull: Left frontal scalp hematoma without skull fracture. CT MAXILLOFACIAL FINDINGS Osseous: --Complex facial fracture types: No LeFort, zygomaticomaxillary complex or nasoorbitoethmoidal fracture. --Simple fracture types: Mildly displaced and comminuted fractures of the nasal bones. --Mandible, hard palate and teeth: No acute abnormality. Orbits: The globes are intact. Normal appearance of the intra- and extraconal fat. Symmetric extraocular muscles. Sinuses: No fluid levels or advanced mucosal thickening. Soft tissues: Soft tissue swelling adjacent to the nose. Left paramedian frontal scalp hematoma. IMPRESSION: 1. No acute intracranial abnormality. Normal appearance of the brain for age. 2. Left frontal scalp hematoma without skull fracture. 3. Comminuted and mildly displaced fractures of the nasal bones. Electronically Signed   By: Ulyses Jarred M.D.   On: 03/03/2018  15:45    Procedures .Marland KitchenLaceration Repair Date/Time: 03/04/2018 8:53 AM Performed by: Glyn Ade, PA-C Authorized by: Glyn Ade, PA-C   Consent:    Consent obtained:  Verbal and emergent situation   Consent given by:  Patient   Risks discussed:  Infection, pain, poor cosmetic result, poor wound healing and retained foreign body   Alternatives discussed:  No treatment and delayed treatment Anesthesia (see MAR for exact dosages):    Anesthesia method:  None Laceration details:    Location:  Face   Face location:  Nose   Length (cm):  0.5   Depth (mm):  2 Repair type:    Repair type:  Simple Exploration:    Hemostasis achieved with:  Direct pressure   Wound exploration: wound explored through full range of motion and entire depth of wound probed and visualized     Contaminated: no   Treatment:    Area cleansed with:  Betadine   Amount of cleaning:  Standard   Irrigation solution:  Sterile saline   Irrigation volume:  50ccs   Irrigation method:  Tap Skin repair:    Repair method:  Tissue adhesive Approximation:    Approximation:  Close Post-procedure details:    Dressing:  Open (no dressing)   Patient tolerance of procedure:  Tolerated well, no immediate complications   (including critical care time)  Medications Ordered in ED Medications  Tdap (BOOSTRIX) injection 0.5 mL (0.5 mLs Intramuscular Given 03/03/18  1540)     Initial Impression / Assessment and Plan / ED Course  I have reviewed the triage vital signs and the nursing notes.  Pertinent labs & imaging results that were available during my care of the patient were reviewed by me and considered in my medical decision making (see chart for details).     CT scan of head without acute intracranial abnormality.  CT maxillofacial scan reveals comminuted and mildly displaced fractures of the nasal bones.  Left hand x-ray without acute fracture or abnormality.  Patient has a small laceration on the  bridge of her nose.  This is shallow and cleaned with Betadine and sterile saline.  Closed with Dermabond.  Her tetanus was updated in the emergency department.  Counseled her on ice over her nose and scalp hematoma.  Have discussed precautions with nasal fractures including avoiding blowing the nose.  Patient placed in wrist brace for comfort of her left wrist.  Discussed RICE protocol with family members and Tylenol for any pain that Brianna Carr may have.  Patient and her daughter at bedside agree with the above plan and voiced understanding.  Discharge home.  This was a shared visit with Dr. Wilson Singer who also saw the patient and agrees with plan and discharge home.  Final Clinical Impressions(s) / ED Diagnoses   Final diagnoses:  Fall, initial encounter  Scalp hematoma, initial encounter  Left wrist pain  Closed fracture of nasal bone, initial encounter    ED Discharge Orders    None       Bernarda Caffey 03/04/18 5809    Virgel Manifold, MD 03/04/18 1308

## 2018-03-03 NOTE — ED Triage Notes (Addendum)
Patient reports that she fell in the driveway. Patient and husband deny that she had LOC. Patient's husband states it was the way she turned when walking. Patient has a hematoma to the forehead and a laceration to the bridge of the nose and a skin tear to the left elbow, left 5th finger. Patient denies any injuries to the knees.  Patient denies blood thinners.

## 2018-03-03 NOTE — Discharge Instructions (Signed)
You have a broken nose and hematoma on her scalp.  Please apply ice pack for 15 minutes at a time at least twice a day to help with swelling and pain.   Avoid blowing your nose.  Please use wrist brace to help with your left wrist pain.  No broken bones on x-ray.  You can take Tylenol at home for pain and also apply ice and elevate the wrist when you can.  I have written a prescription for tramadol which is a pain medicine that you can take if tylenol is not cutting it.   Return to the emergency department if you have any new or concerning symptoms like worsening headache, confusion, vomiting or trouble with your vision.

## 2018-03-05 DIAGNOSIS — S022XXA Fracture of nasal bones, initial encounter for closed fracture: Secondary | ICD-10-CM | POA: Diagnosis not present

## 2018-03-05 DIAGNOSIS — W19XXXA Unspecified fall, initial encounter: Secondary | ICD-10-CM | POA: Diagnosis not present

## 2018-03-05 DIAGNOSIS — S6992XA Unspecified injury of left wrist, hand and finger(s), initial encounter: Secondary | ICD-10-CM | POA: Diagnosis not present

## 2018-05-15 DIAGNOSIS — E039 Hypothyroidism, unspecified: Secondary | ICD-10-CM | POA: Diagnosis not present

## 2018-05-15 DIAGNOSIS — E119 Type 2 diabetes mellitus without complications: Secondary | ICD-10-CM | POA: Diagnosis not present

## 2018-05-15 DIAGNOSIS — I10 Essential (primary) hypertension: Secondary | ICD-10-CM | POA: Diagnosis not present

## 2018-05-16 DIAGNOSIS — R300 Dysuria: Secondary | ICD-10-CM | POA: Diagnosis not present

## 2018-05-16 DIAGNOSIS — N390 Urinary tract infection, site not specified: Secondary | ICD-10-CM | POA: Diagnosis not present

## 2018-05-22 DIAGNOSIS — E119 Type 2 diabetes mellitus without complications: Secondary | ICD-10-CM | POA: Diagnosis not present

## 2018-05-22 DIAGNOSIS — R21 Rash and other nonspecific skin eruption: Secondary | ICD-10-CM | POA: Diagnosis not present

## 2018-05-22 DIAGNOSIS — M79642 Pain in left hand: Secondary | ICD-10-CM | POA: Diagnosis not present

## 2018-05-22 DIAGNOSIS — E039 Hypothyroidism, unspecified: Secondary | ICD-10-CM | POA: Diagnosis not present

## 2018-05-22 DIAGNOSIS — I10 Essential (primary) hypertension: Secondary | ICD-10-CM | POA: Diagnosis not present

## 2018-05-22 DIAGNOSIS — H6120 Impacted cerumen, unspecified ear: Secondary | ICD-10-CM | POA: Diagnosis not present

## 2018-05-22 DIAGNOSIS — Z780 Asymptomatic menopausal state: Secondary | ICD-10-CM | POA: Diagnosis not present

## 2018-05-22 DIAGNOSIS — Z23 Encounter for immunization: Secondary | ICD-10-CM | POA: Diagnosis not present

## 2018-05-22 DIAGNOSIS — E7800 Pure hypercholesterolemia, unspecified: Secondary | ICD-10-CM | POA: Diagnosis not present

## 2018-10-23 ENCOUNTER — Other Ambulatory Visit: Payer: Self-pay | Admitting: Internal Medicine

## 2018-10-23 DIAGNOSIS — Z1231 Encounter for screening mammogram for malignant neoplasm of breast: Secondary | ICD-10-CM

## 2018-10-29 DIAGNOSIS — E7800 Pure hypercholesterolemia, unspecified: Secondary | ICD-10-CM | POA: Diagnosis not present

## 2018-10-29 DIAGNOSIS — R21 Rash and other nonspecific skin eruption: Secondary | ICD-10-CM | POA: Diagnosis not present

## 2018-10-29 DIAGNOSIS — E039 Hypothyroidism, unspecified: Secondary | ICD-10-CM | POA: Diagnosis not present

## 2018-10-29 DIAGNOSIS — E119 Type 2 diabetes mellitus without complications: Secondary | ICD-10-CM | POA: Diagnosis not present

## 2018-12-01 ENCOUNTER — Ambulatory Visit
Admission: RE | Admit: 2018-12-01 | Discharge: 2018-12-01 | Disposition: A | Discharge status: Home / Self Care | Payer: Medicare HMO | Source: Ambulatory Visit | Attending: Internal Medicine | Admitting: Internal Medicine

## 2018-12-01 DIAGNOSIS — Z1231 Encounter for screening mammogram for malignant neoplasm of breast: Secondary | ICD-10-CM

## 2019-04-01 DIAGNOSIS — R300 Dysuria: Secondary | ICD-10-CM | POA: Diagnosis not present

## 2019-04-01 DIAGNOSIS — N390 Urinary tract infection, site not specified: Secondary | ICD-10-CM | POA: Diagnosis not present

## 2019-04-22 DIAGNOSIS — E7800 Pure hypercholesterolemia, unspecified: Secondary | ICD-10-CM | POA: Diagnosis not present

## 2019-04-22 DIAGNOSIS — E119 Type 2 diabetes mellitus without complications: Secondary | ICD-10-CM | POA: Diagnosis not present

## 2019-04-22 DIAGNOSIS — E039 Hypothyroidism, unspecified: Secondary | ICD-10-CM | POA: Diagnosis not present

## 2019-04-29 DIAGNOSIS — E119 Type 2 diabetes mellitus without complications: Secondary | ICD-10-CM | POA: Diagnosis not present

## 2019-04-29 DIAGNOSIS — E7800 Pure hypercholesterolemia, unspecified: Secondary | ICD-10-CM | POA: Diagnosis not present

## 2019-04-29 DIAGNOSIS — E039 Hypothyroidism, unspecified: Secondary | ICD-10-CM | POA: Diagnosis not present

## 2019-04-29 DIAGNOSIS — I10 Essential (primary) hypertension: Secondary | ICD-10-CM | POA: Diagnosis not present

## 2019-07-14 DIAGNOSIS — R69 Illness, unspecified: Secondary | ICD-10-CM | POA: Diagnosis not present

## 2019-07-23 DIAGNOSIS — E039 Hypothyroidism, unspecified: Secondary | ICD-10-CM | POA: Diagnosis not present

## 2019-07-23 DIAGNOSIS — E119 Type 2 diabetes mellitus without complications: Secondary | ICD-10-CM | POA: Diagnosis not present

## 2019-07-23 DIAGNOSIS — I10 Essential (primary) hypertension: Secondary | ICD-10-CM | POA: Diagnosis not present

## 2019-08-03 DIAGNOSIS — M25552 Pain in left hip: Secondary | ICD-10-CM | POA: Diagnosis not present

## 2019-08-03 DIAGNOSIS — S79912A Unspecified injury of left hip, initial encounter: Secondary | ICD-10-CM | POA: Diagnosis not present

## 2019-08-03 DIAGNOSIS — E039 Hypothyroidism, unspecified: Secondary | ICD-10-CM | POA: Diagnosis not present

## 2019-08-03 DIAGNOSIS — I10 Essential (primary) hypertension: Secondary | ICD-10-CM | POA: Diagnosis not present

## 2019-08-03 DIAGNOSIS — E119 Type 2 diabetes mellitus without complications: Secondary | ICD-10-CM | POA: Diagnosis not present

## 2019-11-15 ENCOUNTER — Ambulatory Visit: Payer: Medicare Other | Attending: Internal Medicine

## 2019-11-15 DIAGNOSIS — Z23 Encounter for immunization: Secondary | ICD-10-CM | POA: Insufficient documentation

## 2019-11-15 NOTE — Progress Notes (Signed)
   Covid-19 Vaccination Clinic  Name:  Brianna Carr    MRN: [id number] DOB: [birth year]  11/15/2019  Brianna Carr was observed post Covid-19 immunization for 15 minutes without incidence. She was provided with Vaccine Information Sheet and instruction to access the V-Safe system.   Brianna Carr was instructed to call 911 with any severe reactions post vaccine: Marland Kitchen Difficulty breathing  . Swelling of your face and throat  . A fast heartbeat  . A bad rash all over your body  . Dizziness and weakness    Immunizations Administered    Name Date Dose VIS Date Route   Pfizer COVID-19 Vaccine 11/15/2019  1:57 PM 0.3 mL 09/04/2019 Intramuscular   Manufacturer: [place]   Lot: [id number]   [place]: [id number]

## 2019-12-09 ENCOUNTER — Ambulatory Visit: Payer: Medicare Other | Attending: Internal Medicine

## 2019-12-09 DIAGNOSIS — Z23 Encounter for immunization: Secondary | ICD-10-CM

## 2019-12-09 NOTE — Progress Notes (Signed)
   Covid-19 Vaccination Clinic  Name:  NAPHTALI BERRINGER    MRN: [id number] DOB: 03/11/1931  12/09/2019  Ms. Cade was observed post Covid-19 immunization for 15 minutes without incident. She was provided with Vaccine Information Sheet and instruction to access the V-Safe system.   Ms. Tatro was instructed to call 911 with any severe reactions post vaccine: Marland Kitchen Difficulty breathing  . Swelling of face and throat  . A fast heartbeat  . A bad rash all over body  . Dizziness and weakness   Immunizations Administered    Name Date Dose VIS Date Route   Pfizer COVID-19 Vaccine 12/09/2019 11:28 AM 0.3 mL 09/04/2019 Intramuscular   Manufacturer: [place]   Lot: [id number]   [place]: [id number]

## 2020-02-08 ENCOUNTER — Other Ambulatory Visit: Payer: Self-pay | Admitting: Internal Medicine

## 2020-02-08 DIAGNOSIS — Z1231 Encounter for screening mammogram for malignant neoplasm of breast: Secondary | ICD-10-CM

## 2020-02-11 ENCOUNTER — Other Ambulatory Visit: Payer: Self-pay

## 2020-02-11 ENCOUNTER — Ambulatory Visit
Admission: RE | Admit: 2020-02-11 | Discharge: 2020-02-11 | Disposition: A | Discharge status: Home / Self Care | Payer: Medicare Other | Source: Ambulatory Visit | Attending: Internal Medicine | Admitting: Internal Medicine

## 2020-02-11 DIAGNOSIS — Z1231 Encounter for screening mammogram for malignant neoplasm of breast: Secondary | ICD-10-CM

## 2020-06-21 ENCOUNTER — Emergency Department (HOSPITAL_COMMUNITY)
Admission: EM | Admit: 2020-06-21 | Discharge: 2020-06-21 | Disposition: A | Discharge status: Home / Self Care | Payer: Medicare Other | Attending: Emergency Medicine | Admitting: Emergency Medicine

## 2020-06-21 ENCOUNTER — Other Ambulatory Visit: Payer: Self-pay

## 2020-06-21 ENCOUNTER — Encounter (HOSPITAL_COMMUNITY): Payer: Self-pay | Admitting: Pediatrics

## 2020-06-21 ENCOUNTER — Emergency Department (HOSPITAL_COMMUNITY): Payer: Medicare Other

## 2020-06-21 DIAGNOSIS — R4781 Slurred speech: Secondary | ICD-10-CM | POA: Insufficient documentation

## 2020-06-21 DIAGNOSIS — R29810 Facial weakness: Secondary | ICD-10-CM | POA: Insufficient documentation

## 2020-06-21 DIAGNOSIS — R4701 Aphasia: Secondary | ICD-10-CM | POA: Diagnosis present

## 2020-06-21 DIAGNOSIS — Z5321 Procedure and treatment not carried out due to patient leaving prior to being seen by health care provider: Secondary | ICD-10-CM | POA: Diagnosis not present

## 2020-06-21 DIAGNOSIS — R410 Disorientation, unspecified: Secondary | ICD-10-CM | POA: Insufficient documentation

## 2020-06-21 LAB — DIFFERENTIAL
Abs Immature Granulocytes: 0.02 10*3/uL (ref 0.00–0.07)
Basophils Absolute: 0.1 10*3/uL (ref 0.0–0.1)
Basophils Relative: 1 %
Eosinophils Absolute: 0.4 10*3/uL (ref 0.0–0.5)
Eosinophils Relative: 7 %
Immature Granulocytes: 0 %
Lymphocytes Relative: 21 %
Lymphs Abs: 1.4 10*3/uL (ref 0.7–4.0)
Monocytes Absolute: 0.7 10*3/uL (ref 0.1–1.0)
Monocytes Relative: 11 %
Neutro Abs: 4.1 10*3/uL (ref 1.7–7.7)
Neutrophils Relative %: 60 %

## 2020-06-21 LAB — I-STAT CHEM 8, ED
BUN: 28 mg/dL — ABNORMAL HIGH (ref 8–23)
Calcium, Ion: 1.2 mmol/L (ref 1.15–1.40)
Chloride: 104 mmol/L (ref 98–111)
Creatinine, Ser: 1.1 mg/dL — ABNORMAL HIGH (ref 0.44–1.00)
Glucose, Bld: 137 mg/dL — ABNORMAL HIGH (ref 70–99)
HCT: 38 % (ref 36.0–46.0)
Hemoglobin: 12.9 g/dL (ref 12.0–15.0)
Potassium: 3.6 mmol/L (ref 3.5–5.1)
Sodium: 143 mmol/L (ref 135–145)
TCO2: 23 mmol/L (ref 22–32)

## 2020-06-21 LAB — COMPREHENSIVE METABOLIC PANEL
ALT: 20 U/L (ref 0–44)
AST: 30 U/L (ref 15–41)
Albumin: 3.9 g/dL (ref 3.5–5.0)
Alkaline Phosphatase: 63 U/L (ref 38–126)
Anion gap: 10 (ref 5–15)
BUN: 26 mg/dL — ABNORMAL HIGH (ref 8–23)
CO2: 24 mmol/L (ref 22–32)
Calcium: 9.5 mg/dL (ref 8.9–10.3)
Chloride: 106 mmol/L (ref 98–111)
Creatinine, Ser: 1.1 mg/dL — ABNORMAL HIGH (ref 0.44–1.00)
GFR calc Af Amer: 52 mL/min — ABNORMAL LOW (ref >60)
GFR calc non Af Amer: 45 mL/min — ABNORMAL LOW (ref >60)
Glucose, Bld: 147 mg/dL — ABNORMAL HIGH (ref 70–99)
Potassium: 3.7 mmol/L (ref 3.5–5.1)
Sodium: 140 mmol/L (ref 135–145)
Total Bilirubin: 0.7 mg/dL (ref 0.3–1.2)
Total Protein: 6.5 g/dL (ref 6.5–8.1)

## 2020-06-21 LAB — CBC
HCT: 41 % (ref 36.0–46.0)
Hemoglobin: 13.1 g/dL (ref 12.0–15.0)
MCH: 28.9 pg (ref 26.0–34.0)
MCHC: 32 g/dL (ref 30.0–36.0)
MCV: 90.3 fL (ref 80.0–100.0)
Platelets: 164 10*3/uL (ref 150–400)
RBC: 4.54 MIL/uL (ref 3.87–5.11)
RDW: 13.1 % (ref 11.5–15.5)
WBC: 6.8 10*3/uL (ref 4.0–10.5)
nRBC: 0 % (ref 0.0–0.2)

## 2020-06-21 LAB — APTT: aPTT: 31 seconds (ref 24–36)

## 2020-06-21 LAB — PROTIME-INR
INR: 1.1 (ref 0.8–1.2)
Prothrombin Time: 13.6 seconds (ref 11.4–15.2)

## 2020-06-21 MED ORDER — SODIUM CHLORIDE 0.9% FLUSH
3.0000 mL | Freq: Once | INTRAVENOUS | Status: DC
Start: 2020-06-21 — End: 2020-06-22

## 2020-06-21 NOTE — ED Notes (Signed)
pts daughter stated she was taking her home due to long wait times.

## 2020-06-21 NOTE — ED Triage Notes (Signed)
Reported slurred speech and facial droop started approx around 1530; resolved upon arrival to ED; patient stated she's ben having some confusion too and unable to recall events/.

## 2021-12-03 ENCOUNTER — Emergency Department (HOSPITAL_COMMUNITY): Payer: Medicare Other

## 2021-12-03 ENCOUNTER — Encounter (HOSPITAL_COMMUNITY): Payer: Self-pay | Admitting: Internal Medicine

## 2021-12-03 ENCOUNTER — Other Ambulatory Visit: Payer: Self-pay

## 2021-12-03 ENCOUNTER — Inpatient Hospital Stay (HOSPITAL_COMMUNITY)
Admission: EM | Admit: 2021-12-03 | Discharge: 2021-12-07 | DRG: 178 | Disposition: A | Discharge status: Home Health | Payer: Medicare Other | Attending: Internal Medicine | Admitting: Internal Medicine

## 2021-12-03 DIAGNOSIS — Z881 Allergy status to other antibiotic agents status: Secondary | ICD-10-CM | POA: Diagnosis not present

## 2021-12-03 DIAGNOSIS — M6282 Rhabdomyolysis: Secondary | ICD-10-CM | POA: Diagnosis present

## 2021-12-03 DIAGNOSIS — E119 Type 2 diabetes mellitus without complications: Secondary | ICD-10-CM | POA: Diagnosis not present

## 2021-12-03 DIAGNOSIS — E440 Moderate protein-calorie malnutrition: Secondary | ICD-10-CM | POA: Diagnosis present

## 2021-12-03 DIAGNOSIS — N179 Acute kidney failure, unspecified: Secondary | ICD-10-CM | POA: Diagnosis present

## 2021-12-03 DIAGNOSIS — U071 COVID-19: Principal | ICD-10-CM | POA: Diagnosis present

## 2021-12-03 DIAGNOSIS — I129 Hypertensive chronic kidney disease with stage 1 through stage 4 chronic kidney disease, or unspecified chronic kidney disease: Secondary | ICD-10-CM | POA: Diagnosis present

## 2021-12-03 DIAGNOSIS — F0390 Unspecified dementia without behavioral disturbance: Secondary | ICD-10-CM | POA: Diagnosis present

## 2021-12-03 DIAGNOSIS — N1832 Chronic kidney disease, stage 3b: Secondary | ICD-10-CM | POA: Diagnosis present

## 2021-12-03 DIAGNOSIS — B9620 Unspecified Escherichia coli [E. coli] as the cause of diseases classified elsewhere: Secondary | ICD-10-CM | POA: Diagnosis present

## 2021-12-03 DIAGNOSIS — Z79890 Hormone replacement therapy (postmenopausal): Secondary | ICD-10-CM

## 2021-12-03 DIAGNOSIS — E1122 Type 2 diabetes mellitus with diabetic chronic kidney disease: Secondary | ICD-10-CM | POA: Diagnosis present

## 2021-12-03 DIAGNOSIS — N390 Urinary tract infection, site not specified: Secondary | ICD-10-CM | POA: Diagnosis present

## 2021-12-03 DIAGNOSIS — I10 Essential (primary) hypertension: Secondary | ICD-10-CM | POA: Diagnosis present

## 2021-12-03 DIAGNOSIS — Z79899 Other long term (current) drug therapy: Secondary | ICD-10-CM

## 2021-12-03 DIAGNOSIS — Z7984 Long term (current) use of oral hypoglycemic drugs: Secondary | ICD-10-CM

## 2021-12-03 DIAGNOSIS — E039 Hypothyroidism, unspecified: Secondary | ICD-10-CM | POA: Diagnosis present

## 2021-12-03 DIAGNOSIS — E871 Hypo-osmolality and hyponatremia: Secondary | ICD-10-CM | POA: Diagnosis present

## 2021-12-03 DIAGNOSIS — F05 Delirium due to known physiological condition: Secondary | ICD-10-CM | POA: Diagnosis present

## 2021-12-03 DIAGNOSIS — E785 Hyperlipidemia, unspecified: Secondary | ICD-10-CM | POA: Diagnosis present

## 2021-12-03 DIAGNOSIS — W19XXXA Unspecified fall, initial encounter: Principal | ICD-10-CM

## 2021-12-03 HISTORY — DX: Hypothyroidism, unspecified: E03.9

## 2021-12-03 LAB — CBC WITH DIFFERENTIAL/PLATELET
Abs Immature Granulocytes: 0.12 10*3/uL — ABNORMAL HIGH (ref 0.00–0.07)
Basophils Absolute: 0 10*3/uL (ref 0.0–0.1)
Basophils Relative: 0 %
Eosinophils Absolute: 0 10*3/uL (ref 0.0–0.5)
Eosinophils Relative: 0 %
HCT: 43.7 % (ref 36.0–46.0)
Hemoglobin: 14.4 g/dL (ref 12.0–15.0)
Immature Granulocytes: 1 %
Lymphocytes Relative: 8 %
Lymphs Abs: 0.8 10*3/uL (ref 0.7–4.0)
MCH: 29.2 pg (ref 26.0–34.0)
MCHC: 33 g/dL (ref 30.0–36.0)
MCV: 88.6 fL (ref 80.0–100.0)
Monocytes Absolute: 1 10*3/uL (ref 0.1–1.0)
Monocytes Relative: 10 %
Neutro Abs: 7.7 10*3/uL (ref 1.7–7.7)
Neutrophils Relative %: 81 %
Platelets: 240 10*3/uL (ref 150–400)
RBC: 4.93 MIL/uL (ref 3.87–5.11)
RDW: 13.3 % (ref 11.5–15.5)
WBC: 9.6 10*3/uL (ref 4.0–10.5)
nRBC: 0 % (ref 0.0–0.2)

## 2021-12-03 LAB — BASIC METABOLIC PANEL
Anion gap: 13 (ref 5–15)
BUN: 34 mg/dL — ABNORMAL HIGH (ref 8–23)
CO2: 23 mmol/L (ref 22–32)
Calcium: 8.9 mg/dL (ref 8.9–10.3)
Chloride: 94 mmol/L — ABNORMAL LOW (ref 98–111)
Creatinine, Ser: 1.56 mg/dL — ABNORMAL HIGH (ref 0.44–1.00)
GFR, Estimated: 32 mL/min — ABNORMAL LOW (ref >60)
Glucose, Bld: 151 mg/dL — ABNORMAL HIGH (ref 70–99)
Potassium: 4 mmol/L (ref 3.5–5.1)
Sodium: 130 mmol/L — ABNORMAL LOW (ref 135–145)

## 2021-12-03 LAB — RESP PANEL BY RT-PCR (FLU A&B, COVID) ARPGX2
Influenza A by PCR: NEGATIVE
Influenza B by PCR: NEGATIVE
SARS Coronavirus 2 by RT PCR: POSITIVE — AB

## 2021-12-03 LAB — CK: Total CK: 839 U/L — ABNORMAL HIGH (ref 38–234)

## 2021-12-03 MED ORDER — ENOXAPARIN SODIUM 30 MG/0.3ML IJ SOSY
30.0000 mg | PREFILLED_SYRINGE | INTRAMUSCULAR | Status: DC
  Administered 2021-12-03 – 2021-12-06 (×4): 30 mg via SUBCUTANEOUS
  Filled 2021-12-03 (×4): qty 0.3

## 2021-12-03 MED ORDER — LEVOTHYROXINE SODIUM 100 MCG PO TABS
100.0000 ug | ORAL_TABLET | Freq: Every day | ORAL | Status: DC
  Administered 2021-12-04 – 2021-12-07 (×3): 100 ug via ORAL
  Filled 2021-12-03 (×3): qty 1

## 2021-12-03 MED ORDER — GUAIFENESIN-DM 100-10 MG/5ML PO SYRP: 10.0000 mL | ORAL_SOLUTION | ORAL | Status: DC | PRN

## 2021-12-03 MED ORDER — BISOPROLOL FUMARATE 5 MG PO TABS
5.0000 mg | ORAL_TABLET | Freq: Every day | ORAL | Status: DC
  Administered 2021-12-04 – 2021-12-07 (×4): 5 mg via ORAL
  Filled 2021-12-03 (×4): qty 1

## 2021-12-03 MED ORDER — SODIUM CHLORIDE 0.9% FLUSH
3.0000 mL | Freq: Two times a day (BID) | INTRAVENOUS | Status: DC
  Administered 2021-12-04 – 2021-12-07 (×5): 3 mL via INTRAVENOUS

## 2021-12-03 MED ORDER — ONDANSETRON HCL 4 MG PO TABS: 4.0000 mg | ORAL_TABLET | Freq: Four times a day (QID) | ORAL | Status: DC | PRN

## 2021-12-03 MED ORDER — BISACODYL 5 MG PO TBEC: 5.0000 mg | DELAYED_RELEASE_TABLET | Freq: Every day | ORAL | Status: DC | PRN

## 2021-12-03 MED ORDER — HALOPERIDOL LACTATE 5 MG/ML IJ SOLN
5.0000 mg | Freq: Four times a day (QID) | INTRAMUSCULAR | Status: DC | PRN
  Administered 2021-12-04 – 2021-12-05 (×2): 5 mg via INTRAVENOUS
  Filled 2021-12-03 (×2): qty 1

## 2021-12-03 MED ORDER — DOCUSATE SODIUM 100 MG PO CAPS
100.0000 mg | ORAL_CAPSULE | Freq: Two times a day (BID) | ORAL | Status: DC
  Administered 2021-12-04 – 2021-12-07 (×5): 100 mg via ORAL
  Filled 2021-12-03 (×6): qty 1

## 2021-12-03 MED ORDER — SODIUM CHLORIDE 0.9% FLUSH
3.0000 mL | Freq: Two times a day (BID) | INTRAVENOUS | Status: DC
  Administered 2021-12-04 – 2021-12-05 (×4): 3 mL via INTRAVENOUS

## 2021-12-03 MED ORDER — SODIUM CHLORIDE 0.9% FLUSH: 3.0000 mL | INTRAVENOUS | Status: DC | PRN

## 2021-12-03 MED ORDER — INSULIN ASPART 100 UNIT/ML IJ SOLN
0.0000 [IU] | Freq: Three times a day (TID) | INTRAMUSCULAR | Status: DC
  Administered 2021-12-04: 1 [IU] via SUBCUTANEOUS
  Administered 2021-12-05 (×2): 2 [IU] via SUBCUTANEOUS
  Administered 2021-12-05: 3 [IU] via SUBCUTANEOUS
  Administered 2021-12-06 (×2): 2 [IU] via SUBCUTANEOUS

## 2021-12-03 MED ORDER — ACETAMINOPHEN 325 MG PO TABS
650.0000 mg | ORAL_TABLET | Freq: Four times a day (QID) | ORAL | Status: DC | PRN
  Administered 2021-12-07: 650 mg via ORAL
  Filled 2021-12-03: qty 2

## 2021-12-03 MED ORDER — ONDANSETRON HCL 4 MG/2ML IJ SOLN
4.0000 mg | Freq: Four times a day (QID) | INTRAMUSCULAR | Status: DC | PRN
  Administered 2021-12-03: 4 mg via INTRAVENOUS
  Filled 2021-12-03: qty 2

## 2021-12-03 MED ORDER — POLYETHYLENE GLYCOL 3350 17 G PO PACK: 17.0000 g | PACK | Freq: Every day | ORAL | Status: DC | PRN

## 2021-12-03 MED ORDER — TRAMADOL HCL 50 MG PO TABS: 50.0000 mg | ORAL_TABLET | Freq: Four times a day (QID) | ORAL | Status: DC | PRN

## 2021-12-03 MED ORDER — SODIUM CHLORIDE 0.9 % IV BOLUS
1000.0000 mL | Freq: Once | INTRAVENOUS | Status: AC
  Administered 2021-12-03: 1000 mL via INTRAVENOUS

## 2021-12-03 MED ORDER — ZINC SULFATE 220 (50 ZN) MG PO CAPS
220.0000 mg | ORAL_CAPSULE | Freq: Every day | ORAL | Status: DC
  Administered 2021-12-03 – 2021-12-07 (×5): 220 mg via ORAL
  Filled 2021-12-03 (×5): qty 1

## 2021-12-03 MED ORDER — FLEET ENEMA 7-19 GM/118ML RE ENEM: 1.0000 | ENEMA | Freq: Once | RECTAL | Status: DC | PRN

## 2021-12-03 MED ORDER — SIMVASTATIN 20 MG PO TABS
10.0000 mg | ORAL_TABLET | Freq: Every day | ORAL | Status: DC
  Administered 2021-12-04 – 2021-12-05 (×2): 10 mg via ORAL
  Filled 2021-12-03 (×2): qty 1

## 2021-12-03 MED ORDER — ASCORBIC ACID 500 MG PO TABS
500.0000 mg | ORAL_TABLET | Freq: Every day | ORAL | Status: DC
  Administered 2021-12-03 – 2021-12-07 (×5): 500 mg via ORAL
  Filled 2021-12-03 (×5): qty 1

## 2021-12-03 MED ORDER — ALBUTEROL SULFATE HFA 108 (90 BASE) MCG/ACT IN AERS
2.0000 | INHALATION_SPRAY | RESPIRATORY_TRACT | Status: DC | PRN
  Filled 2021-12-03: qty 6.7

## 2021-12-03 MED ORDER — SODIUM CHLORIDE 0.9 % IV SOLN: 250.0000 mL | INTRAVENOUS | Status: DC | PRN

## 2021-12-03 NOTE — ED Notes (Signed)
Pt ambulatory to restroom with walker and RN. Pt able to get to bathroom became very weak and legs gave out. RN able to assist pt to the toilet without falling or injury.  ? ?Unable to collect urine sample do to fecal contamination  ?

## 2021-12-03 NOTE — ED Notes (Signed)
Pt placed on 3L [place], O2 was 87% on room air. ?

## 2021-12-03 NOTE — ED Notes (Signed)
[place] daughter to be called when pt gets a room upstairs  ?

## 2021-12-03 NOTE — Assessment & Plan Note (Signed)
-  She does not appear to be taking medications for this issue at this time ?-Delirium precautions ?-Family is considering moving in with her full-time ?

## 2021-12-03 NOTE — Assessment & Plan Note (Signed)
Continue Zocor 

## 2021-12-03 NOTE — ED Provider Notes (Cosign Needed)
Care of patient assumed from [place] at 1456.  Agree with history, physical exam and plan.  See their note for further details. Briefly, 88 year old female with a past medical history significant for diabetes dementia presents to the emergency department after being found on the floor.  Patient was last seen at 10 PM yesterday evening and was found laying on the floor today around noon.  Daughter notes that when EMS arrived to patient's house patient was able to get up and ambulate due to generalized weakness.  Patient has had decreased p.o. intake over the last few days.   Physical Exam  BP (!) 136/99 (BP Location: Right Arm)    Pulse 92    Temp 98.2 F (36.8 C) (Oral)    Resp 14    Ht '5\' 3"'$  (1.6 m)    Wt 63.5 kg    SpO2 96%    BMI 24.80 kg/m   Physical Exam Vitals and nursing note reviewed.  Constitutional:      General: She is not in acute distress.    Appearance: She is not ill-appearing, toxic-appearing or diaphoretic.  HENT:     Head: Normocephalic.  Eyes:     General: No scleral icterus.       Right eye: No discharge.        Left eye: No discharge.  Cardiovascular:     Rate and Rhythm: Normal rate.  Pulmonary:     Effort: Pulmonary effort is normal. No respiratory distress.     Breath sounds: Normal breath sounds.  Skin:    General: Skin is warm and dry.  Neurological:     General: No focal deficit present.     Mental Status: She is alert.     GCS: GCS eye subscore is 4. GCS verbal subscore is 5. GCS motor subscore is 6.     Comments: Alert to person only.  Can stand and ambulate with walker and nurse assistance x1.  Psychiatric:        Behavior: Behavior is cooperative.    Procedures  Procedures  ED Course / MDM   Clinical Course as of 12/03/21 1528  Sun Dec 03, 2021  1457 CK Total(!): 839 [CA]  1457 Creatinine(!): 1.56 [CA]  1457 BUN(!): 34 [CA]  1457 Sodium(!): 130 [CA]    Clinical Course User Index [CA] Suzy Bouchard, PA-C   Medical Decision  Making Amount and/or Complexity of Data Reviewed Labs: ordered. Decision-making details documented in ED Course. Radiology: ordered.  Risk Decision regarding hospitalization.   At time of handoff chest x-ray, pelvis x-ray, urinalysis and COVID-19/influenza testing pending.  Patient is currently receiving fluid bolus due to AKI and increased CK.  I spoke to patient's granddaughter at bedside reports that the patient appears more confused than normal.  Patient is normally alert to person, place, and intermittently time.  At present patient is alert to person only.    Chest x-ray and pelvis x-ray were independently viewed and interpreted by myself.  I agree with radiology interpretation of no acute osseous abnormality, no active cardiopulmonary disease.  With x-ray imaging and CT imaging unremarkable for acute injury will stand and ambulate patient.    Patient was able to stand and ambulate with walker and assistance from nurse x1.  Patient became easily fatigued when walking a short distance.  COVID-19 testing is positive.  Patient's daughter reports that her husband currently has COVID-19 however has not had any contact with the patient.  Patient received vaccination x2 however  no boosters.  Patient has had cough, decreased p.o. intake, and increased weakness over the last 2 to 3 weeks.  Patient family is agreeable to antiviral therapy however with no exact timetable of COVID-19 symptoms will hold this medication at this time.  Due to patient's positive COVID-19 test, increased confusion, and AKI will consult hospitalist for admission.  Patient's daughter is agreeable with this plan.  I spoke to Dr. Lorin Mercy who will see the patient for admission.  Brianna Carr was evaluated in Emergency Department on 12/03/2021 for the symptoms described in the history of present illness. She was evaluated in the context of the global COVID-19 pandemic, which necessitated consideration that the patient might  be at risk for infection with the SARS-CoV-2 virus that causes COVID-19. Institutional protocols and algorithms that pertain to the evaluation of patients at risk for COVID-19 are in a state of rapid change based on information released by regulatory bodies including the CDC and federal and state organizations. These policies and algorithms were followed during the patient's care in the ED.      Loni Beckwith, [place] 12/03/21 1644

## 2021-12-03 NOTE — Assessment & Plan Note (Signed)
-  Continue bisoprolol ?-Hold HCTZ (unsure why both 25 mg and 6.25 mg) and Diovan due to ?AKI component ?

## 2021-12-03 NOTE — Assessment & Plan Note (Signed)
-  hold Glucophage ?-Cover with moderate-scale SSI ?

## 2021-12-03 NOTE — Assessment & Plan Note (Signed)
-  Stage 3a in 2021 at last check ?-Likely progressive CKD but there could be an AKI component (family reports minimal PO intake) ?-Will keep strict I/O ?-Nutrition consult, calorie count ?

## 2021-12-03 NOTE — ED Triage Notes (Signed)
Pt arrived by EMS complaining of fall. Pt lives alone and this morning family found her laying on her bedroom floor unable to get up. Pt denies pain and has not obvious injuries.  ? ?Once pt was assisted up, she was weak and unable to get around by herself.  ?Hx of dementia, per family pt is at her baseline.  ? ?

## 2021-12-03 NOTE — Assessment & Plan Note (Signed)
-  Patient with presenting with weakness, found down (no injuries) ?-After COVID test resulted positive, her daughter acknowledged that her husband (who was with the patient on 3/6) developed COVID symptoms and tested positive on 3/9 ?-Patient has also had anorexia, cough, fatigue for 2-3 weeks - although her daughter is not certain this isn't from FTT associated with her progressive cognitive impairment ?-No O2 requirement ?-COVID POSITIVE ?-negative CXR ?-Will admit for further evaluation, close monitoring, and treatment ?-Monitor on telemetry x at least 24 hours ?-At this time, will attempt to avoid use of aerosolized medications and use HFAs instead ?-Will hold treatment for now since the duration of her symptoms is unknown and she appears to have mild disease other than weakness ?-PT/OT consults ?-Encourage mobilization/ambulation as much as possible ?-Patient was seen wearing full PPE including: gown, gloves, N95, and face shield; donning and doffing was in compliance with current standards. ?

## 2021-12-03 NOTE — ED Provider Notes (Signed)
[facility] EMERGENCY DEPARTMENT Provider Note   CSN: [id number] Arrival date & time: 12/03/21  1338     History  Chief Complaint  Patient presents with   Brianna Carr is a 88 y.o. female with a past medical history significant for diabetes and dementia who presents to the ED after being found on the floor.  Daughter at bedside states that patient was found on the floor of her bedroom around noon today.  Patient's granddaughter left patient around 10 PM last night where she was acting her normal self.  Daughter states that patient has been having an intermittent cough for the past few days.  No fever or chills. Daughter notes that when EMS arrived to patient's house, patient was unable to get up and ambulate due to generalized weakness. Decreased po  intake over the past few days. Patient denies any pain; however is difficult to obtain HPI due to dementia. Level 5 caveat.        Home Medications Prior to Admission medications   Medication Sig Start Date End Date Taking? Authorizing Provider  bisoprolol-hydrochlorothiazide (ZIAC) 5-6.25 MG tablet Take 1 tablet by mouth daily. 12/19/17   [provider]  hydrochlorothiazide (HYDRODIURIL) 25 MG tablet Take 25 mg by mouth daily. 12/19/17   [provider]  hydrocortisone 2.5 % ointment Apply 1 application topically 2 (two) times daily. 02/21/18   [provider]  levothyroxine (SYNTHROID, LEVOTHROID) 75 MCG tablet Take 75 mcg by mouth daily. 12/30/17   [provider]  metFORMIN (GLUCOPHAGE) 500 MG tablet Take 250-500 mg by mouth daily.  12/19/17   [provider]  simvastatin (ZOCOR) 10 MG tablet Take 10 mg by mouth daily.    [provider]  traMADol (ULTRAM) 50 MG tablet Take 1 tablet (50 mg total) by mouth every 6 (six) hours as needed. 03/03/18   Glyn Ade, PA-C      Allergies    Sulfamethoxazole    Review of Systems   Review of Systems   Unable to perform ROS: Dementia   Physical Exam Updated Vital Signs BP (!) 136/99 (BP Location: Right Arm)    Pulse 92    Temp 98.2 F (36.8 C) (Oral)    Resp 14    Ht '5\' 3"'$  (1.6 m)    Wt 63.5 kg    SpO2 96%    BMI 24.80 kg/m  Physical Exam Vitals and nursing note reviewed.  Constitutional:      General: She is not in acute distress.    Appearance: She is not ill-appearing.  HENT:     Head: Normocephalic.  Eyes:     Pupils: Pupils are equal, round, and reactive to light.  Cardiovascular:     Rate and Rhythm: Normal rate and regular rhythm.     Pulses: Normal pulses.     Heart sounds: Normal heart sounds. No murmur heard.   No friction rub. No gallop.  Pulmonary:     Effort: Pulmonary effort is normal.     Breath sounds: Normal breath sounds.  Abdominal:     General: Abdomen is flat. There is no distension.     Palpations: Abdomen is soft.     Tenderness: There is no abdominal tenderness. There is no guarding or rebound.  Musculoskeletal:        General: Normal range of motion.     Cervical back: Neck supple.     Comments: No bony tenderness of bilateral hips.  Able to fully range all joints.  Skin:    General: Skin is warm and dry.  Neurological:     General: No focal deficit present.     Mental Status: She is alert.  Psychiatric:        Mood and Affect: Mood normal.        Behavior: Behavior normal.    ED Results / Procedures / Treatments   Labs (all labs ordered are listed, but only abnormal results are displayed) Labs Reviewed  CBC WITH DIFFERENTIAL/PLATELET - Abnormal; Notable for the following components:      Result Value   Abs Immature Granulocytes 0.12 (*)    All other components within normal limits  BASIC METABOLIC PANEL - Abnormal; Notable for the following components:   Sodium 130 (*)    Chloride 94 (*)    Glucose, Bld 151 (*)    BUN 34 (*)    Creatinine, Ser 1.56 (*)    GFR, Estimated 32 (*)    All other components within normal limits  CK -  Abnormal; Notable for the following components:   Total CK 839 (*)    All other components within normal limits  RESP PANEL BY RT-PCR (FLU A&B, COVID) ARPGX2  URINALYSIS, ROUTINE W REFLEX MICROSCOPIC    EKG None  Radiology CT Head Wo Contrast  Result Date: 12/03/2021 CLINICAL DATA:  Patient found down today, unable to arise by herself. Weakness. History of dementia. EXAM: CT HEAD WITHOUT CONTRAST CT CERVICAL SPINE WITHOUT CONTRAST TECHNIQUE: Multidetector CT imaging of the head and cervical spine was performed following the standard protocol without intravenous contrast. Multiplanar CT image reconstructions of the cervical spine were also generated. RADIATION DOSE REDUCTION: This exam was performed according to the departmental dose-optimization program which includes automated exposure control, adjustment of the mA and/or kV according to patient size and/or use of iterative reconstruction technique. COMPARISON:  06/21/2020, and 10/31/2007 cervical MRI FINDINGS: CT HEAD FINDINGS Brain: The brainstem, cerebellum, cerebral peduncles, thalami, basal ganglia, basilar cisterns, and ventricular system appear within normal limits. No intracranial hemorrhage, mass lesion, or acute CVA. Periventricular white matter and corona radiata hypodensities favor chronic ischemic microvascular white matter disease. Vascular: Unremarkable Skull: Unremarkable Sinuses/Orbits: Unremarkable Other: No supplemental non-categorized findings. CT CERVICAL SPINE FINDINGS Alignment: 2 mm anterolisthesis at C7-T1 1.5 mm anterolisthesis at T1-2 and T2-3, roughly similar appearance on 10/31/2007 MRI. Skull base and vertebrae: Fused facet joints at C2-3 potentially with a small amount of solid interbody fusion bridging spurring. No fracture or acute bony findings. Soft tissues and spinal canal: Bilateral common carotid atherosclerotic calcification. Disc levels: Uncinate and facet spurring cause right foraminal impingement at all levels  between C3 and T1; and left foraminal impingement at C4-5, C5-6, and C7-T1. The anterolisthesis at C7-T1 is also contributory to the foraminal narrowing this level. Upper chest: Unremarkable Other: No supplemental non-categorized findings. IMPRESSION: 1. No acute intracranial findings or acute cervical spine findings. 2. Periventricular white matter and corona radiata hypodensities favor chronic ischemic microvascular white matter disease. 3. Multilevel cervical impingement due to spondylosis. Electronically Signed   By: Van Clines M.D.   On: 12/03/2021 14:32   CT Cervical Spine Wo Contrast  Result Date: 12/03/2021 CLINICAL DATA:  Patient found down today, unable to arise by herself. Weakness. History of dementia. EXAM: CT HEAD WITHOUT CONTRAST CT CERVICAL SPINE WITHOUT CONTRAST TECHNIQUE: Multidetector CT imaging of the head and cervical spine was performed following the standard protocol without intravenous contrast. Multiplanar CT image reconstructions of  the cervical spine were also generated. RADIATION DOSE REDUCTION: This exam was performed according to the departmental dose-optimization program which includes automated exposure control, adjustment of the mA and/or kV according to patient size and/or use of iterative reconstruction technique. COMPARISON:  06/21/2020, and 10/31/2007 cervical MRI FINDINGS: CT HEAD FINDINGS Brain: The brainstem, cerebellum, cerebral peduncles, thalami, basal ganglia, basilar cisterns, and ventricular system appear within normal limits. No intracranial hemorrhage, mass lesion, or acute CVA. Periventricular white matter and corona radiata hypodensities favor chronic ischemic microvascular white matter disease. Vascular: Unremarkable Skull: Unremarkable Sinuses/Orbits: Unremarkable Other: No supplemental non-categorized findings. CT CERVICAL SPINE FINDINGS Alignment: 2 mm anterolisthesis at C7-T1 1.5 mm anterolisthesis at T1-2 and T2-3, roughly similar appearance on  10/31/2007 MRI. Skull base and vertebrae: Fused facet joints at C2-3 potentially with a small amount of solid interbody fusion bridging spurring. No fracture or acute bony findings. Soft tissues and spinal canal: Bilateral common carotid atherosclerotic calcification. Disc levels: Uncinate and facet spurring cause right foraminal impingement at all levels between C3 and T1; and left foraminal impingement at C4-5, C5-6, and C7-T1. The anterolisthesis at C7-T1 is also contributory to the foraminal narrowing this level. Upper chest: Unremarkable Other: No supplemental non-categorized findings. IMPRESSION: 1. No acute intracranial findings or acute cervical spine findings. 2. Periventricular white matter and corona radiata hypodensities favor chronic ischemic microvascular white matter disease. 3. Multilevel cervical impingement due to spondylosis. Electronically Signed   By: Van Clines M.D.   On: 12/03/2021 14:32    Procedures Procedures    Medications Ordered in ED Medications  sodium chloride 0.9 % bolus 1,000 mL (has no administration in time range)    ED Course/ Medical Decision Making/ A&P Clinical Course as of 12/03/21 1502  Sun Dec 03, 2021  1457 CK Total(!): 839 [CA]  1457 Creatinine(!): 1.56 [CA]  1457 BUN(!): 34 [CA]  1457 Sodium(!): 130 [CA]    Clinical Course User Index [CA] Suzy Bouchard, PA-C                           Medical Decision Making Amount and/or Complexity of Data Reviewed Independent Historian:     Details: Daughter at bedside provided history Labs: ordered. Decision-making details documented in ED Course. Radiology: ordered and independent interpretation performed. Decision-making details documented in ED Course. ECG/medicine tests: ordered and independent interpretation performed. Decision-making details documented in ED Course.   88 year old female presents to the ED after being found on the floor prior to arrival.  Patient lives alone and has a  history of dementia.  Daughter notes that patient has had decreased p.o. intake and a cough over the past few days.  Patient denies any pain during initial evaluation.  Upon arrival, stable vitals.  Patient in no acute distress.  Reassuring physical exam.  No bony tenderness of bilateral hips.  Able to fully range all joints.  CT head and cervical spine to rule out any intracranial abnormalities.  Routine labs ordered.  CK to rule out rhabdomyolysis.  UA to rule out UTI.  COVID/influenza test given history of cough.  Chest x-ray to rule out evidence of pneumonia.   CT head and cervical spine personally visualized which is negative for any acute abnormalities.  CBC reassuring.  No leukocytosis and normal hemoglobin.  CK elevated at 839.  BMP significant for elevated creatinine at 1.56 and BUN at 34.  IV fluids started.  Patient handed off to Debbe Mounts, PA-C at shift change  pending CXR, pelvic x-ray, COVID test, and UA. If reassuring and patient is able to ambulate here in the ED patient can be disc        Final Clinical Impression(s) / ED Diagnoses Final diagnoses:  Fall, initial encounter    Rx / DC Orders ED Discharge Orders     None         Suzy Bouchard, PA-C 12/03/21 1503    Lorelle Gibbs, DO 12/03/21 1531

## 2021-12-03 NOTE — Assessment & Plan Note (Signed)
Continue Synthroid °

## 2021-12-03 NOTE — H&P (Signed)
History and Physical    Patient: Brianna Carr YIF:[id number] DOB: 01-Sep-1931 DOA: 12/03/2021 DOS: the patient was seen and examined on 12/03/2021 PCP: Pcp, No  Patient coming from: Home - lives alone; NOK: Daughter, Buelah Manis, [phone]   Chief Complaint: Fall  HPI: Brianna Carr is a 88 y.o. female with medical history significant of DM, dementia, and hypothyroidism presenting with a fall.  The patient has underlying dementia and reported is oriented to person and place at baseline; she appears to be at or near baseline at this time.  She is unable to provide any meaningful history, however.    I called and spoke with her daughter.  She lives alone but her children are there to visit daily.  They saw her at 10pm and this AM didn't hear from her.  They found her in the floor this AM.  She was unable to get up.  Her daughter called 911.  She hasn't been eating and drinking for about 2 weeks.  She has had a cough.  She is losing weight.  Her husband was sick last week and was positive Thursday and Saturday AM.  Her daughter denies symptoms.  She is more cantankerous than usual.  Her daughter is considering moving in with her.      ER Course:  COVID, AKI, increased confusion and weakness. +cough, decreased PO intake x 2-3 weeks.  Found on the floor this AM.  Giving 1L IVF.  No meds due to prolonged duration of illness.  No O2 requirement.     Review of Systems: As mentioned in the history of present illness. All other systems reviewed and are negative. Past Medical History:  Diagnosis Date   Diabetes mellitus without complication ([place])    borderline diabetes   Hypothyroidism (acquired)    Past Surgical History:  Procedure Laterality Date   ROTATOR CUFF REPAIR     Social History:  reports that she has never smoked. She has never used smokeless tobacco. She reports that she does not drink alcohol and does not use drugs.  Allergies  Allergen Reactions   Gabapentin Other  (See Comments)    "Unsteady on her feet"   Hydrocodone-Acetaminophen Other (See Comments)    Unknown   Nitrofurantoin Other (See Comments)    not sure   Sulfamethoxazole-Trimethoprim Hives   Sulfamethoxazole Hives and Itching    History reviewed. No pertinent family history.  Prior to Admission medications   Medication Sig Start Date End Date Taking? Authorizing Provider  bisoprolol-hydrochlorothiazide (ZIAC) 5-6.25 MG tablet Take 1 tablet by mouth daily. 12/19/17   [provider]  hydrochlorothiazide (HYDRODIURIL) 25 MG tablet Take 25 mg by mouth daily. 12/19/17   [provider]  hydrocortisone 2.5 % ointment Apply 1 application topically 2 (two) times daily. 02/21/18   [provider]  levothyroxine (SYNTHROID, LEVOTHROID) 75 MCG tablet Take 75 mcg by mouth daily. 12/30/17   [provider]  metFORMIN (GLUCOPHAGE) 500 MG tablet Take 250-500 mg by mouth daily.  12/19/17   [provider]  simvastatin (ZOCOR) 10 MG tablet Take 10 mg by mouth daily.    [provider]  traMADol (ULTRAM) 50 MG tablet Take 1 tablet (50 mg total) by mouth every 6 (six) hours as needed. 03/03/18   Glyn Ade, PA-C    Physical Exam: Vitals:   12/03/21 1341 12/03/21 1343 12/03/21 1636  BP:  (!) 136/99 121/79  Pulse:  92 94  Resp:  14 16  Temp:  98.2 F (36.8 C)   TempSrc:  Oral   SpO2:  96% 94%  Weight: 63.5 kg    Height: '5\' 3"'$  (1.6 m)     General:  Appears calm and comfortable and is in NAD, pleasantly demented Eyes:  PERRL, EOMI, normal lids, iris ENT:  grossly normal hearing, lips & tongue, mmm Neck:  no LAD, masses or thyromegaly Cardiovascular:  RRR, no m/r/g. No LE edema.  Respiratory:   CTA bilaterally with no wheezes/rales/rhonchi.  Normal respiratory effort. Abdomen:  soft, NT, ND Skin:  no rash or induration seen on limited exam Musculoskeletal:  grossly normal tone BUE/BLE, good ROM, no bony abnormality Psychiatric:  grossly  normal mood and affect, speech fluent and appropriate, AOx1-2 Neurologic:  CN 2-12 grossly intact, moves all extremities in coordinated fashion   Radiological Exams on Admission: Independently reviewed - see discussion in A/P where applicable  CT Head Wo Contrast  Result Date: 12/03/2021 CLINICAL DATA:  Patient found down today, unable to arise by herself. Weakness. History of dementia. EXAM: CT HEAD WITHOUT CONTRAST CT CERVICAL SPINE WITHOUT CONTRAST TECHNIQUE: Multidetector CT imaging of the head and cervical spine was performed following the standard protocol without intravenous contrast. Multiplanar CT image reconstructions of the cervical spine were also generated. RADIATION DOSE REDUCTION: This exam was performed according to the departmental dose-optimization program which includes automated exposure control, adjustment of the mA and/or kV according to patient size and/or use of iterative reconstruction technique. COMPARISON:  06/21/2020, and 10/31/2007 cervical MRI FINDINGS: CT HEAD FINDINGS Brain: The brainstem, cerebellum, cerebral peduncles, thalami, basal ganglia, basilar cisterns, and ventricular system appear within normal limits. No intracranial hemorrhage, mass lesion, or acute CVA. Periventricular white matter and corona radiata hypodensities favor chronic ischemic microvascular white matter disease. Vascular: Unremarkable Skull: Unremarkable Sinuses/Orbits: Unremarkable Other: No supplemental non-categorized findings. CT CERVICAL SPINE FINDINGS Alignment: 2 mm anterolisthesis at C7-T1 1.5 mm anterolisthesis at T1-2 and T2-3, roughly similar appearance on 10/31/2007 MRI. Skull base and vertebrae: Fused facet joints at C2-3 potentially with a small amount of solid interbody fusion bridging spurring. No fracture or acute bony findings. Soft tissues and spinal canal: Bilateral common carotid atherosclerotic calcification. Disc levels: Uncinate and facet spurring cause right foraminal  impingement at all levels between C3 and T1; and left foraminal impingement at C4-5, C5-6, and C7-T1. The anterolisthesis at C7-T1 is also contributory to the foraminal narrowing this level. Upper chest: Unremarkable Other: No supplemental non-categorized findings. IMPRESSION: 1. No acute intracranial findings or acute cervical spine findings. 2. Periventricular white matter and corona radiata hypodensities favor chronic ischemic microvascular white matter disease. 3. Multilevel cervical impingement due to spondylosis. Electronically Signed   By: Van Clines M.D.   On: 12/03/2021 14:32   CT Cervical Spine Wo Contrast  Result Date: 12/03/2021 CLINICAL DATA:  Patient found down today, unable to arise by herself. Weakness. History of dementia. EXAM: CT HEAD WITHOUT CONTRAST CT CERVICAL SPINE WITHOUT CONTRAST TECHNIQUE: Multidetector CT imaging of the head and cervical spine was performed following the standard protocol without intravenous contrast. Multiplanar CT image reconstructions of the cervical spine were also generated. RADIATION DOSE REDUCTION: This exam was performed according to the departmental dose-optimization program which includes automated exposure control, adjustment of the mA and/or kV according to patient size and/or use of iterative reconstruction technique. COMPARISON:  06/21/2020, and 10/31/2007 cervical MRI FINDINGS: CT HEAD FINDINGS Brain: The brainstem, cerebellum, cerebral peduncles, thalami, basal ganglia, basilar cisterns, and ventricular system appear within normal limits. No intracranial  hemorrhage, mass lesion, or acute CVA. Periventricular white matter and corona radiata hypodensities favor chronic ischemic microvascular white matter disease. Vascular: Unremarkable Skull: Unremarkable Sinuses/Orbits: Unremarkable Other: No supplemental non-categorized findings. CT CERVICAL SPINE FINDINGS Alignment: 2 mm anterolisthesis at C7-T1 1.5 mm anterolisthesis at T1-2 and T2-3, roughly  similar appearance on 10/31/2007 MRI. Skull base and vertebrae: Fused facet joints at C2-3 potentially with a small amount of solid interbody fusion bridging spurring. No fracture or acute bony findings. Soft tissues and spinal canal: Bilateral common carotid atherosclerotic calcification. Disc levels: Uncinate and facet spurring cause right foraminal impingement at all levels between C3 and T1; and left foraminal impingement at C4-5, C5-6, and C7-T1. The anterolisthesis at C7-T1 is also contributory to the foraminal narrowing this level. Upper chest: Unremarkable Other: No supplemental non-categorized findings. IMPRESSION: 1. No acute intracranial findings or acute cervical spine findings. 2. Periventricular white matter and corona radiata hypodensities favor chronic ischemic microvascular white matter disease. 3. Multilevel cervical impingement due to spondylosis. Electronically Signed   By: Van Clines M.D.   On: 12/03/2021 14:32   DG Pelvis Portable  Result Date: 12/03/2021 CLINICAL DATA:  cough, fall EXAM: PORTABLE PELVIS 1-2 VIEWS COMPARISON:  None. FINDINGS: There is no evidence of pelvic fracture or diastasis. Acute displaced fracture or dislocation of bilateral hips on frontal view. Lumbar degenerative changes. No pelvic bone lesions are seen. IMPRESSION: Negative for acute traumatic injury. Electronically Signed   By: Iven Finn M.D.   On: 12/03/2021 15:12   DG Chest Portable 1 View  Result Date: 12/03/2021 CLINICAL DATA:  cough EXAM: PORTABLE CHEST 1 VIEW COMPARISON:  Chest x-ray 07/07/2009 FINDINGS: The heart and mediastinal contours are unchanged. Aortic calcification. No focal consolidation. No pulmonary edema. No pleural effusion. No pneumothorax. No acute osseous abnormality. IMPRESSION: No active disease. Electronically Signed   By: Iven Finn M.D.   On: 12/03/2021 15:11    EKG: Independently reviewed.  Afib with rate 94; nonspecific ST changes with no evidence of acute  ischemia   Labs on Admission: I have personally reviewed the available labs and imaging studies at the time of the admission.  Pertinent labs:    Na++ 130 Glucose 151 BUN 34/Creatinine 1.56/GFR 32 CK 839 Normal CBC COVID POSITIVE    Assessment and Plan: * COVID-19 -Patient with presenting with weakness, found down (no injuries) -After COVID test resulted positive, her daughter acknowledged that her husband (who was with the patient on 3/6) developed COVID symptoms and tested positive on 3/9 -Patient has also had anorexia, cough, fatigue for 2-3 weeks - although her daughter is not certain this isn't from FTT associated with her progressive cognitive impairment -No O2 requirement -COVID POSITIVE -negative CXR -Will admit for further evaluation, close monitoring, and treatment -Monitor on telemetry x at least 24 hours -At this time, will attempt to avoid use of aerosolized medications and use HFAs instead -Will hold treatment for now since the duration of her symptoms is unknown and she appears to have mild disease other than weakness -PT/OT consults -Encourage mobilization/ambulation as much as possible -Patient was seen wearing full PPE including: gown, gloves, N95, and face shield; donning and doffing was in compliance with current standards.  Dyslipidemia -Continue Zocor  Chronic kidney disease, stage 3b ([place]) -Stage 3a in 2021 at last check -Likely progressive CKD but there could be an AKI component (family reports minimal PO intake) -Will keep strict I/O -Nutrition consult, calorie count  Essential hypertension -Continue bisoprolol -Hold HCTZ (unsure why both  25 mg and 6.25 mg) and Diovan due to ?AKI component  Hypothyroidism (acquired) -Continue Synthroid  Diabetes mellitus without complication ([place]) -hold Glucophage -Cover with moderate-scale SSI  Dementia without behavioral disturbance ([place]) -She does not appear to be taking medications for this issue at  this time -Delirium precautions -Family is considering moving in with her full-time     Advance Care Planning:   Code Status: Full Code - family would like to discuss further,  but had never considered this before  Consults: PT/OT; Nutrition  DVT Prophylaxis: Lovenox  Family Communication: None present; I spoke with her daughter by telephone at the time of admission  Severity of Illness: The appropriate patient status for this patient is INPATIENT. Inpatient status is judged to be reasonable and necessary in order to provide the required intensity of service to ensure the patient's safety. The patient's presenting symptoms, physical exam findings, and initial radiographic and laboratory data in the context of their chronic comorbidities is felt to place them at high risk for further clinical deterioration. Furthermore, it is not anticipated that the patient will be medically stable for discharge from the hospital within 2 midnights of admission.   * I certify that at the point of admission it is my clinical judgment that the patient will require inpatient hospital care spanning beyond 2 midnights from the point of admission due to high intensity of service, high risk for further deterioration and high frequency of surveillance required.*  Author: Karmen Bongo, MD 12/03/2021 6:05 PM  For on call review [email or web address].

## 2021-12-04 DIAGNOSIS — N1832 Chronic kidney disease, stage 3b: Secondary | ICD-10-CM

## 2021-12-04 DIAGNOSIS — E119 Type 2 diabetes mellitus without complications: Secondary | ICD-10-CM | POA: Diagnosis not present

## 2021-12-04 DIAGNOSIS — U071 COVID-19: Secondary | ICD-10-CM | POA: Diagnosis not present

## 2021-12-04 DIAGNOSIS — I10 Essential (primary) hypertension: Secondary | ICD-10-CM

## 2021-12-04 LAB — CBG MONITORING, ED
Glucose-Capillary: 104 mg/dL — ABNORMAL HIGH (ref 70–99)
Glucose-Capillary: 125 mg/dL — ABNORMAL HIGH (ref 70–99)

## 2021-12-04 LAB — URINALYSIS, ROUTINE W REFLEX MICROSCOPIC
Bilirubin Urine: NEGATIVE
Glucose, UA: NEGATIVE mg/dL
Hgb urine dipstick: NEGATIVE
Ketones, ur: NEGATIVE mg/dL
Nitrite: NEGATIVE
Protein, ur: 30 mg/dL — AB
Specific Gravity, Urine: 1.017 (ref 1.005–1.030)
WBC, UA: >50 WBC/hpf — ABNORMAL HIGH (ref 0–5)
pH: 5 (ref 5.0–8.0)

## 2021-12-04 LAB — CBC WITH DIFFERENTIAL/PLATELET
Abs Immature Granulocytes: 0.07 10*3/uL (ref 0.00–0.07)
Basophils Absolute: 0 10*3/uL (ref 0.0–0.1)
Basophils Relative: 0 %
Eosinophils Absolute: 0 10*3/uL (ref 0.0–0.5)
Eosinophils Relative: 0 %
HCT: 39 % (ref 36.0–46.0)
Hemoglobin: 12.9 g/dL (ref 12.0–15.0)
Immature Granulocytes: 1 %
Lymphocytes Relative: 18 %
Lymphs Abs: 1.3 10*3/uL (ref 0.7–4.0)
MCH: 29.4 pg (ref 26.0–34.0)
MCHC: 33.1 g/dL (ref 30.0–36.0)
MCV: 88.8 fL (ref 80.0–100.0)
Monocytes Absolute: 0.9 10*3/uL (ref 0.1–1.0)
Monocytes Relative: 12 %
Neutro Abs: 4.7 10*3/uL (ref 1.7–7.7)
Neutrophils Relative %: 69 %
Platelets: 201 10*3/uL (ref 150–400)
RBC: 4.39 MIL/uL (ref 3.87–5.11)
RDW: 13.4 % (ref 11.5–15.5)
WBC: 6.9 10*3/uL (ref 4.0–10.5)
nRBC: 0 % (ref 0.0–0.2)

## 2021-12-04 LAB — COMPREHENSIVE METABOLIC PANEL
ALT: 20 U/L (ref 0–44)
AST: 59 U/L — ABNORMAL HIGH (ref 15–41)
Albumin: 2.4 g/dL — ABNORMAL LOW (ref 3.5–5.0)
Alkaline Phosphatase: 54 U/L (ref 38–126)
Anion gap: 10 (ref 5–15)
BUN: 41 mg/dL — ABNORMAL HIGH (ref 8–23)
CO2: 23 mmol/L (ref 22–32)
Calcium: 8.5 mg/dL — ABNORMAL LOW (ref 8.9–10.3)
Chloride: 100 mmol/L (ref 98–111)
Creatinine, Ser: 1.67 mg/dL — ABNORMAL HIGH (ref 0.44–1.00)
GFR, Estimated: 29 mL/min — ABNORMAL LOW (ref >60)
Glucose, Bld: 107 mg/dL — ABNORMAL HIGH (ref 70–99)
Potassium: 3.9 mmol/L (ref 3.5–5.1)
Sodium: 133 mmol/L — ABNORMAL LOW (ref 135–145)
Total Bilirubin: 0.4 mg/dL (ref 0.3–1.2)
Total Protein: 5.8 g/dL — ABNORMAL LOW (ref 6.5–8.1)

## 2021-12-04 LAB — GLUCOSE, CAPILLARY
Glucose-Capillary: 106 mg/dL — ABNORMAL HIGH (ref 70–99)
Glucose-Capillary: 187 mg/dL — ABNORMAL HIGH (ref 70–99)

## 2021-12-04 LAB — CK: Total CK: 553 U/L — ABNORMAL HIGH (ref 38–234)

## 2021-12-04 MED ORDER — MOLNUPIRAVIR EUA 200MG CAPSULE: 4.0000 | ORAL_CAPSULE | Freq: Two times a day (BID) | ORAL | Status: DC

## 2021-12-04 MED ORDER — SODIUM CHLORIDE 0.9 % IV SOLN
1.0000 g | INTRAVENOUS | Status: DC
  Administered 2021-12-04 – 2021-12-07 (×4): 1 g via INTRAVENOUS
  Filled 2021-12-04 (×4): qty 10

## 2021-12-04 MED ORDER — LACTATED RINGERS IV SOLN: INTRAVENOUS | Status: AC

## 2021-12-04 MED ORDER — MOLNUPIRAVIR EUA 200MG CAPSULE
4.0000 | ORAL_CAPSULE | Freq: Two times a day (BID) | ORAL | Status: DC
  Administered 2021-12-04 – 2021-12-07 (×7): 800 mg via ORAL
  Filled 2021-12-04 (×2): qty 4

## 2021-12-04 MED ORDER — DEXAMETHASONE SODIUM PHOSPHATE 10 MG/ML IJ SOLN
6.0000 mg | INTRAMUSCULAR | Status: DC
  Administered 2021-12-04 – 2021-12-05 (×2): 6 mg via INTRAVENOUS
  Filled 2021-12-04 (×2): qty 1

## 2021-12-04 NOTE — ED Notes (Signed)
ED TO INPATIENT HANDOFF REPORT  ED Nurse Name and Phone #: Casey Burkitt [phone]  S Name/Age/Gender Brianna Carr 88 y.o. female Room/Bed: 042C/042C  Code Status   Code Status: Full Code  Home/SNF/Other Home Patient oriented to: self Is this baseline? Yes   Triage Complete: Triage complete  Chief Complaint COVID-19 [U07.1]  Triage Note Pt arrived by EMS complaining of fall. Pt lives alone and this morning family found her laying on her bedroom floor unable to get up. Pt denies pain and has not obvious injuries.   Once pt was assisted up, she was weak and unable to get around by herself.  Hx of dementia, per family pt is at her baseline.     Allergies Allergies  Allergen Reactions   Gabapentin Other (See Comments)    "Unsteady on her feet"   Hydrocodone-Acetaminophen Other (See Comments)    Unknown   Nitrofurantoin Other (See Comments)    Reaction not known   Sulfamethoxazole-Trimethoprim Hives, Itching and Other (See Comments)    Bactrim or Septra   Sulfamethoxazole Hives, Itching and Other (See Comments)    (Plain) Sulfamethoxazole    Level of Care/Admitting Diagnosis ED Disposition     ED Disposition  Admit   Condition  --   Comment  Hospital Area: [facility] [[id number]]  Level of Care: Telemetry Medical [[id number]]  May admit patient to [facility] or [facility] if equivalent level of care is available:: Yes  Covid Evaluation: Confirmed COVID Positive  Diagnosis: COVID-19 [[id number]]  Admitting Physician: Karmen Bongo [[id number]]  Attending Physician: Karmen Bongo [[id number]]  Estimated length of stay: past midnight tomorrow  Certification:: I certify this patient will need inpatient services for at least 2 midnights          B Medical/Surgery History Past Medical History:  Diagnosis Date   Diabetes mellitus without complication ([place])    borderline diabetes   Hypothyroidism (acquired)    Past Surgical History:  Procedure  Laterality Date   ROTATOR CUFF REPAIR       A IV Location/Drains/Wounds Patient Lines/Drains/Airways Status     Active Line/Drains/Airways     Name Placement date Placement time Site Days   Peripheral IV 12/03/21 22 G Anterior;Left;Proximal Forearm 12/03/21  1500  Forearm  1            Intake/Output Last 24 hours  Intake/Output Summary (Last 24 hours) at 12/04/2021 1219 Last data filed at 12/04/2021 1103 Gross per 24 hour  Intake 1107.7 ml  Output --  Net 1107.7 ml    Labs/Imaging Results for orders placed or performed during the hospital encounter of 12/03/21 (from the past 48 hour(s))  CBC with Differential     Status: Abnormal   Collection Time: 12/03/21  2:09 PM  Result Value Ref Range   WBC 9.6 4.0 - 10.5 K/uL   RBC 4.93 3.87 - 5.11 MIL/uL   Hemoglobin 14.4 12.0 - 15.0 g/dL   HCT 43.7 36.0 - 46.0 %   MCV 88.6 80.0 - 100.0 fL   MCH 29.2 26.0 - 34.0 pg   MCHC 33.0 30.0 - 36.0 g/dL   RDW 13.3 11.5 - 15.5 %   Platelets 240 150 - 400 K/uL   nRBC 0.0 0.0 - 0.2 %   Neutrophils Relative % 81 %   Neutro Abs 7.7 1.7 - 7.7 K/uL   Lymphocytes Relative 8 %   Lymphs Abs 0.8 0.7 - 4.0 K/uL   Monocytes Relative 10 %  Monocytes Absolute 1.0 0.1 - 1.0 K/uL   Eosinophils Relative 0 %   Eosinophils Absolute 0.0 0.0 - 0.5 K/uL   Basophils Relative 0 %   Basophils Absolute 0.0 0.0 - 0.1 K/uL   Immature Granulocytes 1 %   Abs Immature Granulocytes 0.12 (H) 0.00 - 0.07 K/uL    Comment: Performed at [facility] [address] [place], [place] [address]  Basic metabolic panel     Status: Abnormal   Collection Time: 12/03/21  2:09 PM  Result Value Ref Range   Sodium 130 (L) 135 - 145 mmol/L   Potassium 4.0 3.5 - 5.1 mmol/L   Chloride 94 (L) 98 - 111 mmol/L   CO2 23 22 - 32 mmol/L   Glucose, Bld 151 (H) 70 - 99 mg/dL    Comment: Glucose reference range applies only to samples taken after fasting for at least 8 hours.   BUN 34 (H) 8 - 23 mg/dL   Creatinine, Ser 1.56  (H) 0.44 - 1.00 mg/dL   Calcium 8.9 8.9 - 10.3 mg/dL   GFR, Estimated 32 (L) >60 mL/min    Comment: (NOTE) Calculated using the CKD-EPI Creatinine Equation (2021)    Anion gap 13 5 - 15    Comment: Performed at [facility] [address] [place], [place] [address]  CK     Status: Abnormal   Collection Time: 12/03/21  2:09 PM  Result Value Ref Range   Total CK 839 (H) 38 - 234 U/L    Comment: Performed at [facility], [place] [address] [place], [place] [address]  Resp Panel by RT-PCR (Flu A&B, Covid) Nasopharyngeal Swab     Status: Abnormal   Collection Time: 12/03/21  2:30 PM   Specimen: Nasopharyngeal Swab; Nasopharyngeal(NP) swabs in vial transport medium  Result Value Ref Range   SARS Coronavirus 2 by RT PCR POSITIVE (A) NEGATIVE    Comment: (NOTE) SARS-CoV-2 target nucleic acids are DETECTED.  The SARS-CoV-2 RNA is generally detectable in upper respiratory specimens during the acute phase of infection. Positive results are indicative of the presence of the identified virus, but do not rule out bacterial infection or co-infection with other pathogens not detected by the test. Clinical correlation with patient history and other diagnostic information is necessary to determine patient infection status. The expected result is Negative.  Fact Sheet for Patients: EntrepreneurPulse.com.au  Fact Sheet for Healthcare Providers: IncredibleEmployment.be  This test is not yet approved or cleared by the [place] FDA and  has been authorized for detection and/or diagnosis of SARS-CoV-2 by FDA under an Emergency Use Authorization (EUA).  This EUA will remain in effect (meaning this test can be used) for the duration of  the COVID-19 declaration under Section 564(b)(1) of the A ct, 21 U.S.C. section 360bbb-3(b)(1), unless the authorization is terminated or revoked sooner.     Influenza A by PCR NEGATIVE NEGATIVE   Influenza B by  PCR NEGATIVE NEGATIVE    Comment: (NOTE) The Xpert Xpress SARS-CoV-2/FLU/RSV plus assay is intended as an aid in the diagnosis of influenza from Nasopharyngeal swab specimens and should not be used as a sole basis for treatment. Nasal washings and aspirates are unacceptable for Xpert Xpress SARS-CoV-2/FLU/RSV testing.  Fact Sheet for Patients: EntrepreneurPulse.com.au  Fact Sheet for Healthcare Providers: IncredibleEmployment.be  This test is not yet approved or cleared by the [place] FDA and has been authorized for detection and/or diagnosis of SARS-CoV-2 by FDA under an  Emergency Use Authorization (EUA). This EUA will remain in effect (meaning this test can be used) for the duration of the COVID-19 declaration under Section 564(b)(1) of the Act, 21 U.S.C. section 360bbb-3(b)(1), unless the authorization is terminated or revoked.  Performed at [facility], [place] [address] [place], [place] [address]   CBC with Differential/Platelet     Status: None   Collection Time: 12/04/21  5:24 AM  Result Value Ref Range   WBC 6.9 4.0 - 10.5 K/uL   RBC 4.39 3.87 - 5.11 MIL/uL   Hemoglobin 12.9 12.0 - 15.0 g/dL   HCT 39.0 36.0 - 46.0 %   MCV 88.8 80.0 - 100.0 fL   MCH 29.4 26.0 - 34.0 pg   MCHC 33.1 30.0 - 36.0 g/dL   RDW 13.4 11.5 - 15.5 %   Platelets 201 150 - 400 K/uL   nRBC 0.0 0.0 - 0.2 %   Neutrophils Relative % 69 %   Neutro Abs 4.7 1.7 - 7.7 K/uL   Lymphocytes Relative 18 %   Lymphs Abs 1.3 0.7 - 4.0 K/uL   Monocytes Relative 12 %   Monocytes Absolute 0.9 0.1 - 1.0 K/uL   Eosinophils Relative 0 %   Eosinophils Absolute 0.0 0.0 - 0.5 K/uL   Basophils Relative 0 %   Basophils Absolute 0.0 0.0 - 0.1 K/uL   Immature Granulocytes 1 %   Abs Immature Granulocytes 0.07 0.00 - 0.07 K/uL    Comment: Performed at [facility], [address] [place], [place] [address]  Comprehensive metabolic panel     Status: Abnormal    Collection Time: 12/04/21  5:24 AM  Result Value Ref Range   Sodium 133 (L) 135 - 145 mmol/L   Potassium 3.9 3.5 - 5.1 mmol/L   Chloride 100 98 - 111 mmol/L   CO2 23 22 - 32 mmol/L   Glucose, Bld 107 (H) 70 - 99 mg/dL    Comment: Glucose reference range applies only to samples taken after fasting for at least 8 hours.   BUN 41 (H) 8 - 23 mg/dL   Creatinine, Ser 1.67 (H) 0.44 - 1.00 mg/dL   Calcium 8.5 (L) 8.9 - 10.3 mg/dL   Total Protein 5.8 (L) 6.5 - 8.1 g/dL   Albumin 2.4 (L) 3.5 - 5.0 g/dL   AST 59 (H) 15 - 41 U/L   ALT 20 0 - 44 U/L   Alkaline Phosphatase 54 38 - 126 U/L   Total Bilirubin 0.4 0.3 - 1.2 mg/dL   GFR, Estimated 29 (L) >60 mL/min    Comment: (NOTE) Calculated using the CKD-EPI Creatinine Equation (2021)    Anion gap 10 5 - 15    Comment: Performed at [facility], [place] [address], [place], [place] [address]  CK     Status: Abnormal   Collection Time: 12/04/21  5:24 AM  Result Value Ref Range   Total CK 553 (H) 38 - 234 U/L    Comment: Performed at [facility], [place] [address] [place],  [address]  Urinalysis, Routine w reflex microscopic Urine, Clean Catch     Status: Abnormal   Collection Time: 12/04/21  6:15 AM  Result Value Ref Range   Color, Urine AMBER (A) YELLOW    Comment: BIOCHEMICALS MAY BE AFFECTED BY COLOR   APPearance CLOUDY (A) CLEAR   Specific Gravity, Urine 1.017 1.005 - 1.030   pH 5.0 5.0 - 8.0   Glucose, UA  NEGATIVE NEGATIVE mg/dL   Hgb urine dipstick NEGATIVE NEGATIVE   Bilirubin Urine NEGATIVE NEGATIVE   Ketones, ur NEGATIVE NEGATIVE mg/dL   Protein, ur 30 (A) NEGATIVE mg/dL   Nitrite NEGATIVE NEGATIVE   Leukocytes,Ua MODERATE (A) NEGATIVE   RBC / HPF 6-10 0 - 5 RBC/hpf   WBC, UA >50 (H) 0 - 5 WBC/hpf   Bacteria, UA MANY (A) NONE SEEN   Squamous Epithelial / LPF 0-5 0 - 5   WBC Clumps PRESENT    Mucus PRESENT    Hyaline Casts, UA PRESENT    Granular Casts, UA PRESENT     Comment: Performed at [facility] [address] [place], [place] [address]  CBG monitoring, ED     Status: Abnormal   Collection Time: 12/04/21  8:01 AM  Result Value Ref Range   Glucose-Capillary 104 (H) 70 - 99 mg/dL    Comment: Glucose reference range applies only to samples taken after fasting for at least 8 hours.  CBG monitoring, ED     Status: Abnormal   Collection Time: 12/04/21 11:18 AM  Result Value Ref Range   Glucose-Capillary 125 (H) 70 - 99 mg/dL    Comment: Glucose reference range applies only to samples taken after fasting for at least 8 hours.   CT Head Wo Contrast  Result Date: 12/03/2021 CLINICAL DATA:  Patient found down today, unable to arise by herself. Weakness. History of dementia. EXAM: CT HEAD WITHOUT CONTRAST CT CERVICAL SPINE WITHOUT CONTRAST TECHNIQUE: Multidetector CT imaging of the head and cervical spine was performed following the standard protocol without intravenous contrast. Multiplanar CT image reconstructions of the cervical spine were also generated. RADIATION DOSE REDUCTION: This exam was performed according to the departmental dose-optimization program which includes automated exposure control, adjustment of the mA and/or kV according to patient size and/or use of iterative reconstruction technique. COMPARISON:  06/21/2020, and 10/31/2007 cervical MRI FINDINGS: CT HEAD FINDINGS Brain: The brainstem, cerebellum, cerebral peduncles, thalami, basal ganglia, basilar cisterns, and ventricular system appear within normal limits. No intracranial hemorrhage, mass lesion, or acute CVA. Periventricular white matter and corona radiata hypodensities favor chronic ischemic microvascular white matter disease. Vascular: Unremarkable Skull: Unremarkable Sinuses/Orbits: Unremarkable Other: No supplemental non-categorized findings. CT CERVICAL SPINE FINDINGS Alignment: 2 mm anterolisthesis at C7-T1 1.5 mm anterolisthesis at T1-2 and T2-3, roughly similar appearance on 10/31/2007 MRI. Skull base and vertebrae:  Fused facet joints at C2-3 potentially with a small amount of solid interbody fusion bridging spurring. No fracture or acute bony findings. Soft tissues and spinal canal: Bilateral common carotid atherosclerotic calcification. Disc levels: Uncinate and facet spurring cause right foraminal impingement at all levels between C3 and T1; and left foraminal impingement at C4-5, C5-6, and C7-T1. The anterolisthesis at C7-T1 is also contributory to the foraminal narrowing this level. Upper chest: Unremarkable Other: No supplemental non-categorized findings. IMPRESSION: 1. No acute intracranial findings or acute cervical spine findings. 2. Periventricular white matter and corona radiata hypodensities favor chronic ischemic microvascular white matter disease. 3. Multilevel cervical impingement due to spondylosis. Electronically Signed   By: Van Clines M.D.   On: 12/03/2021 14:32   CT Cervical Spine Wo Contrast  Result Date: 12/03/2021 CLINICAL DATA:  Patient found down today, unable to arise by herself. Weakness. History of dementia. EXAM: CT HEAD WITHOUT CONTRAST CT CERVICAL SPINE WITHOUT CONTRAST TECHNIQUE: Multidetector CT imaging of the head and cervical spine was performed following the standard protocol without intravenous contrast. Multiplanar CT image reconstructions  of the cervical spine were also generated. RADIATION DOSE REDUCTION: This exam was performed according to the departmental dose-optimization program which includes automated exposure control, adjustment of the mA and/or kV according to patient size and/or use of iterative reconstruction technique. COMPARISON:  06/21/2020, and 10/31/2007 cervical MRI FINDINGS: CT HEAD FINDINGS Brain: The brainstem, cerebellum, cerebral peduncles, thalami, basal ganglia, basilar cisterns, and ventricular system appear within normal limits. No intracranial hemorrhage, mass lesion, or acute CVA. Periventricular white matter and corona radiata hypodensities favor  chronic ischemic microvascular white matter disease. Vascular: Unremarkable Skull: Unremarkable Sinuses/Orbits: Unremarkable Other: No supplemental non-categorized findings. CT CERVICAL SPINE FINDINGS Alignment: 2 mm anterolisthesis at C7-T1 1.5 mm anterolisthesis at T1-2 and T2-3, roughly similar appearance on 10/31/2007 MRI. Skull base and vertebrae: Fused facet joints at C2-3 potentially with a small amount of solid interbody fusion bridging spurring. No fracture or acute bony findings. Soft tissues and spinal canal: Bilateral common carotid atherosclerotic calcification. Disc levels: Uncinate and facet spurring cause right foraminal impingement at all levels between C3 and T1; and left foraminal impingement at C4-5, C5-6, and C7-T1. The anterolisthesis at C7-T1 is also contributory to the foraminal narrowing this level. Upper chest: Unremarkable Other: No supplemental non-categorized findings. IMPRESSION: 1. No acute intracranial findings or acute cervical spine findings. 2. Periventricular white matter and corona radiata hypodensities favor chronic ischemic microvascular white matter disease. 3. Multilevel cervical impingement due to spondylosis. Electronically Signed   By: Van Clines M.D.   On: 12/03/2021 14:32   DG Pelvis Portable  Result Date: 12/03/2021 CLINICAL DATA:  cough, fall EXAM: PORTABLE PELVIS 1-2 VIEWS COMPARISON:  None. FINDINGS: There is no evidence of pelvic fracture or diastasis. Acute displaced fracture or dislocation of bilateral hips on frontal view. Lumbar degenerative changes. No pelvic bone lesions are seen. IMPRESSION: Negative for acute traumatic injury. Electronically Signed   By: Iven Finn M.D.   On: 12/03/2021 15:12   DG Chest Portable 1 View  Result Date: 12/03/2021 CLINICAL DATA:  cough EXAM: PORTABLE CHEST 1 VIEW COMPARISON:  Chest x-ray 07/07/2009 FINDINGS: The heart and mediastinal contours are unchanged. Aortic calcification. No focal consolidation. No  pulmonary edema. No pleural effusion. No pneumothorax. No acute osseous abnormality. IMPRESSION: No active disease. Electronically Signed   By: Iven Finn M.D.   On: 12/03/2021 15:11    Pending Labs Unresulted Labs (From admission, onward)     Start     Ordered   12/04/21 0500  CBC with Differential/Platelet  Daily,   R      12/03/21 1717   12/04/21 0500  Comprehensive metabolic panel  Daily,   R      12/03/21 1717   12/03/21 1513  Urine Culture  Once,   STAT       Question:  Indication  Answer:  Altered mental status (if no other cause identified)   12/03/21 1512            Vitals/Pain Today's Vitals   12/04/21 0519 12/04/21 0800 12/04/21 1103 12/04/21 1216  BP: (!) 121/98 114/70 123/67 114/65  Pulse: 84 80 72 75  Resp: (!) '21 16 15 19  '$ Temp:    98.2 F (36.8 C)  TempSrc:    Oral  SpO2: 98% 100% 99% 95%  Weight:      Height:      PainSc:        Isolation Precautions Airborne and Contact precautions  Medications Medications  traMADol (ULTRAM) tablet 50 mg (has no administration in time  range)  simvastatin (ZOCOR) tablet 10 mg (10 mg Oral Given 12/04/21 0955)  bisoprolol (ZEBETA) tablet 5 mg (5 mg Oral Given 12/04/21 0954)  levothyroxine (SYNTHROID) tablet 100 mcg (100 mcg Oral Given 12/04/21 0519)  insulin aspart (novoLOG) injection 0-15 Units (1 Units Subcutaneous Given 12/04/21 1121)  sodium chloride flush (NS) 0.9 % injection 3 mL (3 mLs Intravenous Not Given 12/04/21 1012)  sodium chloride flush (NS) 0.9 % injection 3 mL (3 mLs Intravenous Given 12/04/21 1011)  sodium chloride flush (NS) 0.9 % injection 3 mL (has no administration in time range)  0.9 %  sodium chloride infusion (has no administration in time range)  albuterol (VENTOLIN HFA) 108 (90 Base) MCG/ACT inhaler 2 puff (has no administration in time range)  guaiFENesin-dextromethorphan (ROBITUSSIN DM) 100-10 MG/5ML syrup 10 mL (has no administration in time range)  ascorbic acid (VITAMIN C) tablet 500 mg  (500 mg Oral Given 12/04/21 0955)  zinc sulfate capsule 220 mg (220 mg Oral Given 12/04/21 1014)  acetaminophen (TYLENOL) tablet 650 mg (has no administration in time range)  docusate sodium (COLACE) capsule 100 mg (100 mg Oral Not Given 12/04/21 0944)  polyethylene glycol (MIRALAX / GLYCOLAX) packet 17 g (has no administration in time range)  bisacodyl (DULCOLAX) EC tablet 5 mg (has no administration in time range)  ondansetron (ZOFRAN) tablet 4 mg ( Oral See Alternative 12/03/21 1837)    Or  ondansetron (ZOFRAN) injection 4 mg (4 mg Intravenous Given 12/03/21 1837)  enoxaparin (LOVENOX) injection 30 mg (30 mg Subcutaneous Given 12/03/21 1733)  haloperidol lactate (HALDOL) injection 5 mg (has no administration in time range)  cefTRIAXone (ROCEPHIN) 1 g in sodium chloride 0.9 % 100 mL IVPB (0 g Intravenous Stopped 12/04/21 1103)  molnupiravir EUA (LAGEVRIO) capsule 800 mg (800 mg Oral Given 12/04/21 1026)  lactated ringers infusion ( Intravenous Restarted 12/04/21 1118)  sodium chloride 0.9 % bolus 1,000 mL (0 mLs Intravenous Stopped 12/03/21 1837)    Mobility walks with device High fall risk   Focused Assessments Pulmonary Assessment Handoff:  Lung sounds:   O2 Device: Nasal Cannula O2 Flow Rate (L/min): 2 L/min    R Recommendations: See Admitting Provider Note  Report given to:   Additional Notes:

## 2021-12-04 NOTE — Evaluation (Signed)
Physical Therapy Evaluation ?Patient Details ?Name: Brianna Carr ?MRN: [id number] ?DOB: 06-Sep-1931 ?Today's Date: 12/04/2021 ? ?History of Present Illness ? 88 yo female presenting to ED on 3/12 with a fall. Tested COVID +. PMH including DM, dementia, and hypothyroidism.  ?Clinical Impression ? PTA, pt lives alone, is independent with mobility with no AD, and ADL's. Pt daughter assists with some IADL's, but states pt can be resistant as she values her independence. Pt presents with decreased functional mobility secondary to cognitive deficits, impaired standing balance and gait abnormalities. Pt ambulating 80 feet with no assistive device at a min guard assist level; reports mild left hip pain. Presents as a higher fall risk in light of recent fall, decreased gait speed and safety awareness. Recommend HHPT at d/c. ?   ? ?Recommendations for follow up therapy are one component of a multi-disciplinary discharge planning process, led by the attending physician.  Recommendations may be updated based on patient status, additional functional criteria and insurance authorization. ? ?Follow Up Recommendations Home health PT ? ?  ?Assistance Recommended at Discharge Frequent or constant Supervision/Assistance  ?Patient can return home with the following ? A little help with walking and/or transfers;A little help with bathing/dressing/bathroom;Assistance with cooking/housework;Direct supervision/assist for medications management;Assist for transportation;Help with stairs or ramp for entrance ? ?  ?Equipment Recommendations Rolling walker (2 wheels)  ?Recommendations for Other Services ?    ?  ?Functional Status Assessment Patient has had a recent decline in their functional status and demonstrates the ability to make significant improvements in function in a reasonable and predictable amount of time.  ? ?  ?Precautions / Restrictions Precautions ?Precautions: Fall ?Restrictions ?Weight Bearing Restrictions: No  ? ?   ? ?Mobility ? Bed Mobility ?Overal bed mobility: Needs Assistance ?Bed Mobility: Supine to Sit ?  ?  ?Supine to sit: Supervision ?  ?  ?General bed mobility comments: Supervision for safety ?  ? ?Transfers ?Overall transfer level: Needs assistance ?Equipment used: None ?Transfers: Sit to/from Stand ?Sit to Stand: Min assist ?  ?  ?  ?  ?  ?General transfer comment: Min A for gaining balance in standing ?  ? ?Ambulation/Gait ?Ambulation/Gait assistance: Min guard ?Gait Distance (Feet): 80 Feet ?Assistive device: None ?Gait Pattern/deviations: Step-through pattern, Decreased stride length, Shuffle ?Gait velocity: decreased ?  ?  ?General Gait Details: Slow gait with decreased bilateral foot clearance, increased trunk flexion, min guard for safety ? ?Stairs ?  ?  ?  ?  ?  ? ?Wheelchair Mobility ?  ? ?Modified Rankin (Stroke Patients Only) ?  ? ?  ? ?Balance Overall balance assessment: Needs assistance ?Sitting-balance support: No upper extremity supported, Feet supported ?Sitting balance-Leahy Scale: Good ?  ?  ?Standing balance support: During functional activity, Single extremity supported ?Standing balance-Leahy Scale: Fair ?  ?  ?  ?  ?  ?  ?  ?  ?  ?  ?  ?  ?   ? ? ? ?Pertinent Vitals/Pain Pain Assessment ?Pain Assessment: Faces ?Faces Pain Scale: No hurt  ? ? ?Home Living Family/patient expects to be discharged to:: Private residence ?Living Arrangements: Alone ?Available Help at Discharge: Family;Available PRN/intermittently ?Type of Home: House ?Home Access: Stairs to enter ?  ?Entrance Stairs-Number of Steps: 3 (3-4) ?Alternate Level Stairs-Number of Steps: 12 ?Home Layout: Two level ?Home Equipment: None ?Additional Comments: bedroom on 2nd floor  ?  ?Prior Function Prior Level of Function : Needs assist ?  ?  ?  ?  ?  ?  ?  Mobility Comments: no AD ?ADLs Comments: Pt can make simple meals i.e. cereal, pt daughter brings her to her house for dinner, takes her garbage cans out, assists to supervise pt with meds  but pt often refuses to let her help. Pt daughter reports she is consistent with locking doors and turning stove off when she boils water for tea ?  ? ? ?Hand Dominance  ?   ? ?  ?Extremity/Trunk Assessment  ? Upper Extremity Assessment ?Upper Extremity Assessment: Overall WFL for tasks assessed ?  ? ?Lower Extremity Assessment ?Lower Extremity Assessment: Generalized weakness ?  ? ?Cervical / Trunk Assessment ?Cervical / Trunk Assessment: Kyphotic  ?Communication  ? Communication: No difficulties  ?Cognition Arousal/Alertness: Awake/alert ?Behavior During Therapy: [facility] for tasks assessed/performed ?Overall Cognitive Status: History of cognitive impairments - at baseline ?Area of Impairment: Orientation, Safety/judgement, Problem solving, Awareness ?  ?  ?  ?  ?  ?  ?  ?  ?Orientation Level: Disoriented to, Situation ?  ?  ?  ?Safety/Judgement: Decreased awareness of safety, Decreased awareness of deficits ?Awareness: Emergent ?Problem Solving: Requires verbal cues, Difficulty sequencing ?General Comments: Pt with baseline dementia. Feel she is baseline cognition. Follwing commands. conversational and very agreeable to therapy. Decreased orientation to situation and safety awareness ?  ?  ? ?  ?General Comments General comments (skin integrity, edema, etc.): VSS on RA ? ?  ?Exercises    ? ?Assessment/Plan  ?  ?PT Assessment Patient needs continued PT services  ?PT Problem List Decreased strength;Decreased activity tolerance;Decreased balance;Decreased mobility;Decreased safety awareness ? ?   ?  ?PT Treatment Interventions DME instruction;Gait training;Stair training;Functional mobility training;Therapeutic activities;Therapeutic exercise;Balance training;Patient/family education   ? ?PT Goals (Current goals can be found in the Care Plan section)  ?Acute Rehab PT Goals ?Patient Stated Goal: return home independently ?PT Goal Formulation: With patient/family ?Time For Goal Achievement: 12/18/21 ?Potential to Achieve  Goals: Good ? ?  ?Frequency Min 3X/week ?  ? ? ?Co-evaluation PT/OT/SLP Co-Evaluation/Treatment: Yes ?Reason for Co-Treatment: For patient/therapist safety;To address functional/ADL transfers ?PT goals addressed during session: Mobility/safety with mobility ?OT goals addressed during session: ADL's and self-care ?  ? ? ?  ?AM-PAC PT "6 Clicks" Mobility  ?Outcome Measure Help needed turning from your back to your side while in a flat bed without using bedrails?: None ?Help needed moving from lying on your back to sitting on the side of a flat bed without using bedrails?: A Little ?Help needed moving to and from a bed to a chair (including a wheelchair)?: A Little ?Help needed standing up from a chair using your arms (e.g., wheelchair or bedside chair)?: A Little ?Help needed to walk in hospital room?: A Little ?Help needed climbing 3-5 steps with a railing? : A Little ?6 Click Score: 19 ? ?  ?End of Session   ?Activity Tolerance: Patient tolerated treatment well ?Patient left: in chair;with call bell/phone within reach;with chair alarm set ?Nurse Communication: Mobility status ?PT Visit Diagnosis: Unsteadiness on feet (R26.81);History of falling (Z91.81) ?  ? ?Time: 8127-5170 ?PT Time Calculation (min) (ACUTE ONLY): 24 min ? ? ?Charges:   PT Evaluation ?$PT Eval Moderate Complexity: 1 Mod ?  ?  ?   ? ? ?Wyona Almas, PT, DPT ?Acute Rehabilitation Services ?Pager [phone] ?Office [phone] ? ? ?Carloine Margo Aye ?12/04/2021, 5:00 PM ? ?

## 2021-12-04 NOTE — ED Notes (Signed)
Pt assisted to bedside commode. Pt linens changed. Pericare performed.  ?

## 2021-12-04 NOTE — ED Notes (Signed)
Pt accepted breakfast tray from daughter.  ?

## 2021-12-04 NOTE — Progress Notes (Signed)
?  Transition of Care (TOC) Screening Note ? ? ?Patient Details  ?Name: Brianna Carr ?Date of Birth: [birth year] ? ? ?Transition of Care (TOC) CM/SW Contact:    ?Benard Halsted, LCSW ?Phone Number: ?12/04/2021, 2:04 PM ? ? ? ?Transition of Care Department [facility]) has reviewed patient and no TOC needs have been identified at this time. We will continue to monitor patient advancement through interdisciplinary progression rounds. If new patient transition needs arise, please place a TOC consult. ? ? ?

## 2021-12-04 NOTE — Progress Notes (Addendum)
PROGRESS NOTE    Brianna Carr  BXU:[id number] DOB: 06-13-1931 DOA: 12/03/2021 PCP: Merryl Hacker, No    Chief Complaint  Patient presents with   Fall    Brief Narrative:   Brianna Carr is a 88 y.o. female with medical history significant of DM, dementia, and hypothyroidism presenting with a fall.  Patient was found in the floor in the morning by her family, she was last seen day before, by herself, demented, but multiple family members check on her daily, patient has not been eating or drinking well for last 2 weeks, she reports cough, weight loss, sick with COVID over the weekend and patient was there, patient tested positive for COVID-19 on admission, admitted for further work-up.    Assessment & Plan:   Principal Problem:   COVID-19 Active Problems:   Dementia without behavioral disturbance (HCC)   Diabetes mellitus without complication (HCC)   Hypothyroidism (acquired)   Essential hypertension   Chronic kidney disease, stage 3b ([place])   Dyslipidemia   COVID-19 infection -Patient with presenting with weakness, found down (no injuries) -After COVID test resulted positive, her daughter acknowledged that her husband (who was with the patient on 3/6) developed COVID symptoms and tested positive on 3/9 -Patient has also had anorexia, cough, fatigue for 2-3 weeks - although her daughter is not certain this isn't from FTT associated with her progressive cognitive impairment -We will start patient on molnupiravir  -We will start  on low-dose steroids as well -Incentive spirometer -COVID POSITIVE -negative CXR -PT/OT consults -Encourage mobilization/ambulation as much as possible   Mild rhabdomyolysis -Total CK is elevated.  839 on  admission, improving this morning at 553, continue with gentle hydration and avoid volume overload.  UTI  - continue with IV Rocephin, follow urine cultures.    Dyslipidemia -Continue Zocor   Chronic kidney disease, stage 3b ([place]) -Stage 3a in  2021 at last check -Likely progressive CKD but there could be an AKI component (family reports minimal PO intake) -Will keep strict I/O -Nutrition consult, calorie count   Essential hypertension -Continue bisoprolol -Hold HCTZ (unsure why both 25 mg and 6.25 mg) and Diovan due to ?AKI component   Hypothyroidism (acquired) -Continue Synthroid   Diabetes mellitus without complication ([place]) -hold Glucophage -Cover with moderate-scale SSI   Dementia without behavioral disturbance ([place]) -She does not appear to be taking medications for this issue at this time -Delirium precautions -Family is considering moving in with her full-time      DVT prophylaxis: [place] lovenox Code Status: Full Family Communication: none at bedside Disposition:   Status is: Inpatient Remains inpatient appropriate because: IV abx and IV fluids    Consultants:  none  Subjective:  Patient reports generalized weakness, and fatigue, but reports she is feeling better, reports some cough, she denies dyspnea or fever. Objective: Vitals:   12/04/21 0800 12/04/21 1103 12/04/21 1216 12/04/21 1324  BP: 114/70 123/67 114/65 91/74  Pulse: 80 72 75 66  Resp: '16 15 19 19  '$ Temp:   98.2 F (36.8 C) 98 F (36.7 C)  TempSrc:   Oral Oral  SpO2: 100% 99% 95% 95%  Weight:      Height:        Intake/Output Summary (Last 24 hours) at 12/04/2021 1351 Last data filed at 12/04/2021 1103 Gross per 24 hour  Intake 1107.7 ml  Output --  Net 1107.7 ml   Filed Weights   12/03/21 1341  Weight: 63.5 kg    Examination:  Awake  Alert, pleasant, frail, no apparent distress Symmetrical Chest wall movement, Good air movement bilaterally, CTAB RRR,No Gallops,Rubs or new Murmurs, No Parasternal Heave +ve B.Sounds, Abd Soft, No tenderness, No rebound - guarding or rigidity. No Cyanosis, Clubbing or edema, No new Rash or bruise      Data Reviewed: I have personally reviewed following labs and imaging  studies  CBC: Recent Labs  Lab 12/03/21 1409 12/04/21 0524  WBC 9.6 6.9  NEUTROABS 7.7 4.7  HGB 14.4 12.9  HCT 43.7 39.0  MCV 88.6 88.8  PLT 240 622    Basic Metabolic Panel: Recent Labs  Lab 12/03/21 1409 12/04/21 0524  NA 130* 133*  K 4.0 3.9  CL 94* 100  CO2 23 23  GLUCOSE 151* 107*  BUN 34* 41*  CREATININE 1.56* 1.67*  CALCIUM 8.9 8.5*    GFR: Estimated Creatinine Clearance: 20.5 mL/min (A) (by C-G formula based on SCr of 1.67 mg/dL (H)).  Liver Function Tests: Recent Labs  Lab 12/04/21 0524  AST 59*  ALT 20  ALKPHOS 54  BILITOT 0.4  PROT 5.8*  ALBUMIN 2.4*    CBG: Recent Labs  Lab 12/04/21 0801 12/04/21 1118  GLUCAP 104* 125*     Recent Results (from the past 240 hour(s))  Resp Panel by RT-PCR (Flu A&B, Covid) Nasopharyngeal Swab     Status: Abnormal   Collection Time: 12/03/21  2:30 PM   Specimen: Nasopharyngeal Swab; Nasopharyngeal(NP) swabs in vial transport medium  Result Value Ref Range Status   SARS Coronavirus 2 by RT PCR POSITIVE (A) NEGATIVE Final    Comment: (NOTE) SARS-CoV-2 target nucleic acids are DETECTED.  The SARS-CoV-2 RNA is generally detectable in upper respiratory specimens during the acute phase of infection. Positive results are indicative of the presence of the identified virus, but do not rule out bacterial infection or co-infection with other pathogens not detected by the test. Clinical correlation with patient history and other diagnostic information is necessary to determine patient infection status. The expected result is Negative.  Fact Sheet for Patients: EntrepreneurPulse.com.au  Fact Sheet for Healthcare Providers: IncredibleEmployment.be  This test is not yet approved or cleared by the [place] FDA and  has been authorized for detection and/or diagnosis of SARS-CoV-2 by FDA under an Emergency Use Authorization (EUA).  This EUA will remain in effect (meaning  this test can be used) for the duration of  the COVID-19 declaration under Section 564(b)(1) of the A ct, 21 U.S.C. section 360bbb-3(b)(1), unless the authorization is terminated or revoked sooner.     Influenza A by PCR NEGATIVE NEGATIVE Final   Influenza B by PCR NEGATIVE NEGATIVE Final    Comment: (NOTE) The Xpert Xpress SARS-CoV-2/FLU/RSV plus assay is intended as an aid in the diagnosis of influenza from Nasopharyngeal swab specimens and should not be used as a sole basis for treatment. Nasal washings and aspirates are unacceptable for Xpert Xpress SARS-CoV-2/FLU/RSV testing.  Fact Sheet for Patients: EntrepreneurPulse.com.au  Fact Sheet for Healthcare Providers: IncredibleEmployment.be  This test is not yet approved or cleared by the [place] FDA and has been authorized for detection and/or diagnosis of SARS-CoV-2 by FDA under an Emergency Use Authorization (EUA). This EUA will remain in effect (meaning this test can be used) for the duration of the COVID-19 declaration under Section 564(b)(1) of the Act, 21 U.S.C. section 360bbb-3(b)(1), unless the authorization is terminated or revoked.  Performed at [facility], [place] [address], [place], [place] [address]  Radiology Studies: CT Head Wo Contrast  Result Date: 12/03/2021 CLINICAL DATA:  Patient found down today, unable to arise by herself. Weakness. History of dementia. EXAM: CT HEAD WITHOUT CONTRAST CT CERVICAL SPINE WITHOUT CONTRAST TECHNIQUE: Multidetector CT imaging of the head and cervical spine was performed following the standard protocol without intravenous contrast. Multiplanar CT image reconstructions of the cervical spine were also generated. RADIATION DOSE REDUCTION: This exam was performed according to the departmental dose-optimization program which includes automated exposure control, adjustment of the mA and/or kV according to patient size and/or use  of iterative reconstruction technique. COMPARISON:  06/21/2020, and 10/31/2007 cervical MRI FINDINGS: CT HEAD FINDINGS Brain: The brainstem, cerebellum, cerebral peduncles, thalami, basal ganglia, basilar cisterns, and ventricular system appear within normal limits. No intracranial hemorrhage, mass lesion, or acute CVA. Periventricular white matter and corona radiata hypodensities favor chronic ischemic microvascular white matter disease. Vascular: Unremarkable Skull: Unremarkable Sinuses/Orbits: Unremarkable Other: No supplemental non-categorized findings. CT CERVICAL SPINE FINDINGS Alignment: 2 mm anterolisthesis at C7-T1 1.5 mm anterolisthesis at T1-2 and T2-3, roughly similar appearance on 10/31/2007 MRI. Skull base and vertebrae: Fused facet joints at C2-3 potentially with a small amount of solid interbody fusion bridging spurring. No fracture or acute bony findings. Soft tissues and spinal canal: Bilateral common carotid atherosclerotic calcification. Disc levels: Uncinate and facet spurring cause right foraminal impingement at all levels between C3 and T1; and left foraminal impingement at C4-5, C5-6, and C7-T1. The anterolisthesis at C7-T1 is also contributory to the foraminal narrowing this level. Upper chest: Unremarkable Other: No supplemental non-categorized findings. IMPRESSION: 1. No acute intracranial findings or acute cervical spine findings. 2. Periventricular white matter and corona radiata hypodensities favor chronic ischemic microvascular white matter disease. 3. Multilevel cervical impingement due to spondylosis. Electronically Signed   By: Van Clines M.D.   On: 12/03/2021 14:32   CT Cervical Spine Wo Contrast  Result Date: 12/03/2021 CLINICAL DATA:  Patient found down today, unable to arise by herself. Weakness. History of dementia. EXAM: CT HEAD WITHOUT CONTRAST CT CERVICAL SPINE WITHOUT CONTRAST TECHNIQUE: Multidetector CT imaging of the head and cervical spine was performed  following the standard protocol without intravenous contrast. Multiplanar CT image reconstructions of the cervical spine were also generated. RADIATION DOSE REDUCTION: This exam was performed according to the departmental dose-optimization program which includes automated exposure control, adjustment of the mA and/or kV according to patient size and/or use of iterative reconstruction technique. COMPARISON:  06/21/2020, and 10/31/2007 cervical MRI FINDINGS: CT HEAD FINDINGS Brain: The brainstem, cerebellum, cerebral peduncles, thalami, basal ganglia, basilar cisterns, and ventricular system appear within normal limits. No intracranial hemorrhage, mass lesion, or acute CVA. Periventricular white matter and corona radiata hypodensities favor chronic ischemic microvascular white matter disease. Vascular: Unremarkable Skull: Unremarkable Sinuses/Orbits: Unremarkable Other: No supplemental non-categorized findings. CT CERVICAL SPINE FINDINGS Alignment: 2 mm anterolisthesis at C7-T1 1.5 mm anterolisthesis at T1-2 and T2-3, roughly similar appearance on 10/31/2007 MRI. Skull base and vertebrae: Fused facet joints at C2-3 potentially with a small amount of solid interbody fusion bridging spurring. No fracture or acute bony findings. Soft tissues and spinal canal: Bilateral common carotid atherosclerotic calcification. Disc levels: Uncinate and facet spurring cause right foraminal impingement at all levels between C3 and T1; and left foraminal impingement at C4-5, C5-6, and C7-T1. The anterolisthesis at C7-T1 is also contributory to the foraminal narrowing this level. Upper chest: Unremarkable Other: No supplemental non-categorized findings. IMPRESSION: 1. No acute intracranial findings or acute cervical spine findings. 2. Periventricular white  matter and corona radiata hypodensities favor chronic ischemic microvascular white matter disease. 3. Multilevel cervical impingement due to spondylosis. Electronically Signed   By:  Van Clines M.D.   On: 12/03/2021 14:32   DG Pelvis Portable  Result Date: 12/03/2021 CLINICAL DATA:  cough, fall EXAM: PORTABLE PELVIS 1-2 VIEWS COMPARISON:  None. FINDINGS: There is no evidence of pelvic fracture or diastasis. Acute displaced fracture or dislocation of bilateral hips on frontal view. Lumbar degenerative changes. No pelvic bone lesions are seen. IMPRESSION: Negative for acute traumatic injury. Electronically Signed   By: Iven Finn M.D.   On: 12/03/2021 15:12   DG Chest Portable 1 View  Result Date: 12/03/2021 CLINICAL DATA:  cough EXAM: PORTABLE CHEST 1 VIEW COMPARISON:  Chest x-ray 07/07/2009 FINDINGS: The heart and mediastinal contours are unchanged. Aortic calcification. No focal consolidation. No pulmonary edema. No pleural effusion. No pneumothorax. No acute osseous abnormality. IMPRESSION: No active disease. Electronically Signed   By: Iven Finn M.D.   On: 12/03/2021 15:11        Scheduled Meds:  vitamin C  500 mg Oral Daily   bisoprolol  5 mg Oral Daily   docusate sodium  100 mg Oral BID   enoxaparin (LOVENOX) injection  30 mg Subcutaneous Q24H   insulin aspart  0-15 Units Subcutaneous TID WC   levothyroxine  100 mcg Oral Q0600   molnupiravir EUA  4 capsule Oral BID   simvastatin  10 mg Oral Daily   sodium chloride flush  3 mL Intravenous Q12H   sodium chloride flush  3 mL Intravenous Q12H   zinc sulfate  220 mg Oral Daily   Continuous Infusions:  sodium chloride     cefTRIAXone (ROCEPHIN)  IV Stopped (12/04/21 1103)   lactated ringers 50 mL/hr at 12/04/21 1118     LOS: 1 day       Phillips Climes, MD [facility]   To contact the attending provider between 7A-7P or the covering provider during after hours 7P-7A, please log into the web site [email or web address] and access using universal [place] password for that web site. If you do not have the password, please call the hospital operator.  12/04/2021, 1:51 PM

## 2021-12-04 NOTE — ED Notes (Signed)
Breakfast order placed ?

## 2021-12-04 NOTE — Evaluation (Signed)
Occupational Therapy Evaluation ?Patient Details ?Name: Brianna Carr ?MRN: [id number] ?DOB: 1931/03/31 ?Today's Date: 12/04/2021 ? ? ?History of Present Illness 88 yo female presenting to ED on 3/12 with a fall. Tested COVID +. PMH including DM, dementia, and hypothyroidism.  ? ?Clinical Impression ?  ?PTA, pt was living alone and reports she performs all ADLs and IADLs independently; reports she daughters live nearby and check in. Pt currently requiring Min A for LB ADLs and functional mobility. Pt presenting with decreased cognition and balance impacting her safe performance of ADLs. Pt would benefit from further acute OT to facilitate safe dc. Pending increased support from family, recommend dc to home with [place] for further OT to optimize safety, independence with ADLs, and return to PLOF.   ?   ? ?Recommendations for follow up therapy are one component of a multi-disciplinary discharge planning process, led by the attending physician.  Recommendations may be updated based on patient status, additional functional criteria and insurance authorization.  ? ?Follow Up Recommendations ? Home health OT  ?  ?Assistance Recommended at Discharge Frequent or constant Supervision/Assistance  ?Patient can return home with the following A little help with walking and/or transfers;A little help with bathing/dressing/bathroom ? ?  ?Functional Status Assessment ? Patient has had a recent decline in their functional status and demonstrates the ability to make significant improvements in function in a reasonable and predictable amount of time.  ?Equipment Recommendations ? Tub/shower seat  ?  ?Recommendations for Other Services PT consult ? ? ?  ?Precautions / Restrictions Precautions ?Precautions: Fall  ? ?  ? ?Mobility Bed Mobility ?Overal bed mobility: Needs Assistance ?Bed Mobility: Supine to Sit ?  ?  ?Supine to sit: Supervision ?  ?  ?General bed mobility comments: Supervision for safety ?  ? ?Transfers ?Overall transfer  level: Needs assistance ?Equipment used: None ?Transfers: Sit to/from Stand ?Sit to Stand: Min assist ?  ?  ?  ?  ?  ?General transfer comment: Min A for gaining balance in standing ?  ? ?  ?Balance Overall balance assessment: Needs assistance ?Sitting-balance support: No upper extremity supported, Feet supported ?Sitting balance-Leahy Scale: Good ?  ?  ?Standing balance support: During functional activity, Single extremity supported ?Standing balance-Leahy Scale: Poor ?  ?  ?  ?  ?  ?  ?  ?  ?  ?  ?  ?  ?   ? ?ADL either performed or assessed with clinical judgement  ? ?ADL Overall ADL's : Needs assistance/impaired ?Eating/Feeding: Set up;Sitting ?  ?Grooming: Set up;Supervision/safety;Sitting ?  ?Upper Body Bathing: Min guard;Sitting ?  ?Lower Body Bathing: Minimal assistance;Sit to/from stand ?  ?Upper Body Dressing : Min guard;Sitting ?  ?Lower Body Dressing: Minimal assistance;Sit to/from stand ?Lower Body Dressing Details (indicate cue type and reason): Min A for initating donning socks. Pt then able to use figure four and don socks ?Toilet Transfer: Minimal assistance;Ambulation ?Toilet Transfer Details (indicate cue type and reason): Min A for gaining balance in standing ?  ?  ?  ?  ?Functional mobility during ADLs: Minimal assistance;+2 for safety/equipment (with and without RW) ?General ADL Comments: Pt presenting with decreased balance and awareness of deficits/safety  ? ? ? ?Vision   ?   ?   ?Perception   ?  ?Praxis   ?  ? ?Pertinent Vitals/Pain Pain Assessment ?Pain Assessment: Faces ?Faces Pain Scale: No hurt ?Pain Intervention(s): Monitored during session  ? ? ? ?Hand Dominance   ?  ?Extremity/Trunk  Assessment Upper Extremity Assessment ?Upper Extremity Assessment: Overall WFL for tasks assessed ?  ?Lower Extremity Assessment ?Lower Extremity Assessment: Defer to PT evaluation ?  ?Cervical / Trunk Assessment ?Cervical / Trunk Assessment: Normal ?  ?Communication Communication ?Communication: No  difficulties ?  ?Cognition Arousal/Alertness: Awake/alert ?Behavior During Therapy: [facility] for tasks assessed/performed ?Overall Cognitive Status: History of cognitive impairments - at baseline ?Area of Impairment: Orientation, Safety/judgement, Problem solving, Awareness ?  ?  ?  ?  ?  ?  ?  ?  ?Orientation Level: Disoriented to, Situation ?  ?  ?  ?Safety/Judgement: Decreased awareness of safety, Decreased awareness of deficits ?Awareness: Emergent ?Problem Solving: Requires verbal cues, Difficulty sequencing ?General Comments: Pt with baseline dementia. Feel she is baseline cognition. Follwing commands. conversational and very agreeable to therapy. Decreased orientation to situation and safety awareness ?  ?  ?General Comments  VSS on RA ? ?  ?Exercises   ?  ?Shoulder Instructions    ? ? ?Home Living Family/patient expects to be discharged to:: Private residence ?Living Arrangements: Alone ?Available Help at Discharge: Family;Available PRN/intermittently ?Type of Home: House ?Home Access: Stairs to enter ?Entrance Stairs-Number of Steps: 3 (3-4) ?  ?Home Layout: Two level ?Alternate Level Stairs-Number of Steps: 12 ?  ?Bathroom Shower/Tub: Walk-in shower ?  ?  ?  ?  ?Home Equipment: None ?  ?Additional Comments: bedroom on 2nd floor ?  ? ?  ?Prior Functioning/Environment Prior Level of Function : Needs assist ?  ?  ?  ?  ?  ?  ?Mobility Comments: no AD ?ADLs Comments: Reports she is independent in ADLs and IADLs. Unsure of reliability due to baseline dementia ?  ? ?  ?  ?OT Problem List: Decreased strength;Decreased range of motion;Decreased activity tolerance;Impaired balance (sitting and/or standing);Decreased knowledge of use of DME or AE;Decreased knowledge of precautions ?  ?   ?OT Treatment/Interventions: Self-care/ADL training;Therapeutic exercise;Energy conservation;DME and/or AE instruction;Therapeutic activities;Patient/family education  ?  ?OT Goals(Current goals can be found in the care plan section)  Acute Rehab OT Goals ?Patient Stated Goal: "Go home and be with my daughter" ?OT Goal Formulation: With patient ?Time For Goal Achievement: 12/18/21 ?Potential to Achieve Goals: Good  ?OT Frequency: Min 2X/week ?  ? ?Co-evaluation PT/OT/SLP Co-Evaluation/Treatment: Yes ?Reason for Co-Treatment: To address functional/ADL transfers;Necessary to address cognition/behavior during functional activity ?  ?OT goals addressed during session: ADL's and self-care ?  ? ?  ?AM-PAC OT "6 Clicks" Daily Activity     ?Outcome Measure Help from another person eating meals?: A Little ?Help from another person taking care of personal grooming?: A Little ?Help from another person toileting, which includes using toliet, bedpan, or urinal?: A Little ?Help from another person bathing (including washing, rinsing, drying)?: A Little ?Help from another person to put on and taking off regular upper body clothing?: A Little ?Help from another person to put on and taking off regular lower body clothing?: A Little ?6 Click Score: 18 ?  ?End of Session Equipment Utilized During Treatment: Rolling walker (2 wheels) ?Nurse Communication: Mobility status ? ?Activity Tolerance: Patient tolerated treatment well ?Patient left: in chair;with call bell/phone within reach;with chair alarm set ? ?OT Visit Diagnosis: Unsteadiness on feet (R26.81);Other abnormalities of gait and mobility (R26.89);Muscle weakness (generalized) (M62.81)  ?              ?Time: 3785-8850 ?OT Time Calculation (min): 26 min ?Charges:  OT General Charges ?$OT Visit: 1 Visit ?OT Evaluation ?$OT Eval Moderate Complexity:  1 Mod ? ?Connelly [place] MSOT, OTR/L ?Acute Rehab ?Pager: [phone] ?Office: [phone] ? ?Brianna Carr ?12/04/2021, 4:15 PM ?

## 2021-12-04 NOTE — ED Notes (Signed)
Pt given breakfast tray. PT states, " I don't feel like eating".  ?

## 2021-12-05 DIAGNOSIS — F0390 Unspecified dementia without behavioral disturbance: Secondary | ICD-10-CM | POA: Diagnosis not present

## 2021-12-05 DIAGNOSIS — N1832 Chronic kidney disease, stage 3b: Secondary | ICD-10-CM | POA: Diagnosis not present

## 2021-12-05 DIAGNOSIS — U071 COVID-19: Secondary | ICD-10-CM | POA: Diagnosis not present

## 2021-12-05 DIAGNOSIS — N179 Acute kidney failure, unspecified: Secondary | ICD-10-CM | POA: Diagnosis not present

## 2021-12-05 DIAGNOSIS — M6282 Rhabdomyolysis: Secondary | ICD-10-CM

## 2021-12-05 LAB — CBC WITH DIFFERENTIAL/PLATELET
Abs Immature Granulocytes: 0.09 10*3/uL — ABNORMAL HIGH (ref 0.00–0.07)
Basophils Absolute: 0 10*3/uL (ref 0.0–0.1)
Basophils Relative: 0 %
Eosinophils Absolute: 0 10*3/uL (ref 0.0–0.5)
Eosinophils Relative: 0 %
HCT: 38.6 % (ref 36.0–46.0)
Hemoglobin: 13 g/dL (ref 12.0–15.0)
Immature Granulocytes: 2 %
Lymphocytes Relative: 14 %
Lymphs Abs: 0.7 10*3/uL (ref 0.7–4.0)
MCH: 29.5 pg (ref 26.0–34.0)
MCHC: 33.7 g/dL (ref 30.0–36.0)
MCV: 87.5 fL (ref 80.0–100.0)
Monocytes Absolute: 0.3 10*3/uL (ref 0.1–1.0)
Monocytes Relative: 6 %
Neutro Abs: 4.1 10*3/uL (ref 1.7–7.7)
Neutrophils Relative %: 78 %
Platelets: 185 10*3/uL (ref 150–400)
RBC: 4.41 MIL/uL (ref 3.87–5.11)
RDW: 13.4 % (ref 11.5–15.5)
WBC: 5.2 10*3/uL (ref 4.0–10.5)
nRBC: 0 % (ref 0.0–0.2)

## 2021-12-05 LAB — GLUCOSE, CAPILLARY
Glucose-Capillary: 145 mg/dL — ABNORMAL HIGH (ref 70–99)
Glucose-Capillary: 191 mg/dL — ABNORMAL HIGH (ref 70–99)
Glucose-Capillary: 135 mg/dL — ABNORMAL HIGH (ref 70–99)
Glucose-Capillary: 159 mg/dL — ABNORMAL HIGH (ref 70–99)

## 2021-12-05 LAB — CK: Total CK: 626 U/L — ABNORMAL HIGH (ref 38–234)

## 2021-12-05 LAB — COMPREHENSIVE METABOLIC PANEL
ALT: 22 U/L (ref 0–44)
AST: 62 U/L — ABNORMAL HIGH (ref 15–41)
Albumin: 2.2 g/dL — ABNORMAL LOW (ref 3.5–5.0)
Alkaline Phosphatase: 45 U/L (ref 38–126)
Anion gap: 10 (ref 5–15)
BUN: 49 mg/dL — ABNORMAL HIGH (ref 8–23)
CO2: 22 mmol/L (ref 22–32)
Calcium: 8.1 mg/dL — ABNORMAL LOW (ref 8.9–10.3)
Chloride: 98 mmol/L (ref 98–111)
Creatinine, Ser: 1.85 mg/dL — ABNORMAL HIGH (ref 0.44–1.00)
GFR, Estimated: 26 mL/min — ABNORMAL LOW (ref >60)
Glucose, Bld: 165 mg/dL — ABNORMAL HIGH (ref 70–99)
Potassium: 4.1 mmol/L (ref 3.5–5.1)
Sodium: 130 mmol/L — ABNORMAL LOW (ref 135–145)
Total Bilirubin: 0.5 mg/dL (ref 0.3–1.2)
Total Protein: 5.2 g/dL — ABNORMAL LOW (ref 6.5–8.1)

## 2021-12-05 MED ORDER — SODIUM CHLORIDE 0.9 % IV SOLN: INTRAVENOUS | Status: DC

## 2021-12-05 MED ORDER — ENSURE ENLIVE PO LIQD
237.0000 mL | Freq: Two times a day (BID) | ORAL | Status: DC
  Administered 2021-12-06 – 2021-12-07 (×2): 237 mL via ORAL

## 2021-12-05 MED ORDER — ADULT MULTIVITAMIN W/MINERALS CH
1.0000 | ORAL_TABLET | Freq: Every day | ORAL | Status: DC
  Administered 2021-12-05 – 2021-12-07 (×3): 1 via ORAL
  Filled 2021-12-05 (×3): qty 1

## 2021-12-05 MED ORDER — SODIUM CHLORIDE 0.9 % IV SOLN: INTRAVENOUS | Status: AC

## 2021-12-05 MED ORDER — HALOPERIDOL LACTATE 5 MG/ML IJ SOLN
2.0000 mg | Freq: Four times a day (QID) | INTRAMUSCULAR | Status: DC | PRN
  Administered 2021-12-05 – 2021-12-06 (×3): 2 mg via INTRAVENOUS
  Filled 2021-12-05 (×4): qty 1

## 2021-12-05 NOTE — Progress Notes (Signed)
Initial Nutrition Assessment ? ?DOCUMENTATION CODES:  ? ?Non-severe (moderate) malnutrition in context of chronic illness ? ?INTERVENTION:  ? ?Multivitamin w/ minerals daily ?Ensure Enlive po BID, each supplement provides 350 kcal and 20 grams of protein. ?Liberalize pt diet to regular due to poor PO intake and malnutrition.  ?Encourage good PO intake ?Meal ordering with assist ? ?NUTRITION DIAGNOSIS:  ? ?Moderate Malnutrition related to chronic illness (Dementia) as evidenced by moderate muscle depletion, mild fat depletion. ? ?GOAL:  ? ?Patient will meet greater than or equal to 90% of their needs ? ?MONITOR:  ? ?PO intake, Supplement acceptance, Labs, Weight trends ? ?REASON FOR ASSESSMENT:  ? ?Consult ?Assessment of nutrition requirement/status, Calorie Count ? ?ASSESSMENT:  ? ?88 y.o. female presented to the ED after being found down at home. PMH includes DM, HTN, CKD IIIb, and dementia. Pt admitted with COVID-19, poor PO intake and AKI on CKD.  ? ?Pt resting in bed. Daughter at bedside.  ? ?Pt reports that she has been eating ok at home. Pt reports that she knows that she needs to eat at home and that she always makes sure that she eats something. Pt reports that she likes Ensure and drinks them occassionally; daughter reports that she does have Ensure's at home but does not think that she is drinking them.  ? ?Pt thinks that her UBW is ~140#. Pt thinks that she has lost weight recent due to not doing as much around the house; daughter endorses weight loss. No weights within the past year within EMR to assess pt weight loss. Suspect that current weight is stated versus actual weight.  ? ?Discussed ONS with pt and daughter, both agreeable to ONS while admitted. Provided daughter with ideas to increase ONS at home and increase pt intake. Will add to discharge instructions. ?Recommend bringing in food from outside that pt enjoys to increase PO intake. ? ?Medications reviewed and include: Vitamin C, Decadron,  Colace, SSI 0-15 units TID, Zinc Sulfate, IV antibiotics ?Labs reviewed: Sodium 130, BUN 49, Creatinine 1.85, 24 hr CBG 104-191 ? ?NUTRITION - FOCUSED PHYSICAL EXAM: ? ?Flowsheet Row Most Recent Value  ?Orbital Region Mild depletion  ?Upper Arm Region No depletion  ?Thoracic and Lumbar Region No depletion  ?Buccal Region Mild depletion  ?Temple Region No depletion  ?Clavicle Bone Region Mild depletion  ?Clavicle and Acromion Bone Region Mild depletion  ?Scapular Bone Region Mild depletion  ?Dorsal Hand No depletion  ?Patellar Region Moderate depletion  ?Anterior Thigh Region Moderate depletion  ?Posterior Calf Region Moderate depletion  ?Edema (RD Assessment) None  ?Hair Reviewed  ?Eyes Reviewed  ?Mouth Reviewed  ?Skin Reviewed  ?Nails Reviewed  ? ?Diet Order:   ?Diet Order   ? ?       ?  Diet Carb Modified Fluid consistency: Thin; Room service appropriate? Yes  Diet effective now       ?  ? ?  ?  ? ?  ? ?EDUCATION NEEDS:  ? ?No education needs have been identified at this time ? ?Skin:  Skin Assessment: Reviewed RN Assessment ? ?Last BM:  No Documentation ? ?Height:  ?Ht Readings from Last 1 Encounters:  ?12/03/21 '5\' 3"'$  (1.6 m)  ? ? ?Weight:  ?Wt Readings from Last 1 Encounters:  ?12/03/21 63.5 kg  ? ?Ideal Body Weight:  52.3 kg ? ?BMI:  Body mass index is 24.8 kg/m?. ? ?Estimated Nutritional Needs:  ?Kcal:  1900-2100 ?Protein:  95-110 grams ?Fluid:  >/= 1.9 L ? ? ? ?  Brianna Carr RD, LDN ?Clinical Dietitian ?See AMiON for contact information.  ? ?

## 2021-12-05 NOTE — Progress Notes (Signed)
?PROGRESS NOTE ? ? ? ?Brianna Carr  MOL:[id number] DOB: 12/30/1930 DOA: 12/03/2021 ?PCP: Pcp, No  ? ? ?Chief Complaint  ?Patient presents with  ? Fall  ? ? ?Brief Narrative:  ? ?Brianna Carr is a 88 y.o. female with medical history significant of DM, dementia, and hypothyroidism presenting with a fall.  Patient was found in the floor in the morning by her family, she was last seen day before, by herself, demented, but multiple family members check on her daily, patient has not been eating or drinking well for last 2 weeks, she reports cough, weight loss, sick with COVID over the weekend and patient was there, patient tested positive for COVID-19 on admission, admitted for further work-up.   ? ?Assessment & Plan: ?  ?Principal Problem: ?  COVID-19 ?Active Problems: ?  Dementia without behavioral disturbance ([place]) ?  Diabetes mellitus without complication ([place]) ?  Hypothyroidism (acquired) ?  Essential hypertension ?  Chronic kidney disease, stage 3b ([place]) ?  Dyslipidemia ?  Rhabdomyolysis ?  AKI (acute kidney injury) ([place]) ? ? ?COVID-19 infection ?-Patient with presenting with weakness, found down (no injuries) ?-After COVID test resulted positive, her daughter acknowledged that her husband (who was with the patient on 3/6) developed COVID symptoms and tested positive on 3/9 ?-Patient has also had anorexia, cough, fatigue for 2-3 weeks - although her daughter is not certain this isn't from FTT associated with her progressive cognitive impairment ?-continue with molnupiravir  ?-continue with low dose Dexamethason ?-Incentive spirometer ?-COVID POSITIVE ?-PT/OT consults ?-Encourage mobilization/ambulation as much as possible ? ? ?AKI on  CKD IIIA with Mild rhabdomyolysis ?-Total CK is elevated.  839 on  admission, she was on the floor for unknown period of time, it is trending down . ?-Creatinine still trending up, will increase her IV fluids to 100 cc/h.  . ?-No evidence of retention, continue to monitor bladder  scan. ? ?UTI  ?- continue with IV Rocephin, follow urine cultures.   ? ?Dyslipidemia ?-Continue Zocor ?  ?Chronic kidney disease, stage 3b ([place]) ?-Stage 3a in 2021 at last check ?-Likely progressive CKD but there could be an AKI component (family reports minimal PO intake) ?-Will keep strict I/O ?-Nutrition consult, calorie count ?  ?Essential hypertension ?-Continue bisoprolol ?-Hold HCTZ (unsure why both 25 mg and 6.25 mg) and Diovan due to ?AKI component ?  ?Hypothyroidism (acquired) ?-Continue Synthroid ?  ?Diabetes mellitus without complication ([place]) ?-hold Glucophage ?-Cover with moderate-scale SSI ?  ?Dementia without behavioral disturbance ([place]) ?-She does not appear to be taking medications for this issue at this time ?-Delirium precautions ?-Family is considering moving in with her full-time ? ?Hospital delirium ?-Patient with significant delirium overnight, this morning she is more calm, but remains much more confused than her baseline, will add as needed Haldol ?  ? ?DVT prophylaxis: [place] lovenox ?Code Status: Full ?Family Communication: Discussed with daughter Mardene Celeste at bedside. ?Disposition:  ? ?Status is: Inpatient ?Remains inpatient appropriate because: IV abx and IV fluids ? ?  ?Consultants:  ?none ? ?Subjective: ? ?Patient reports generalized weakness, and fatigue, but reports she is feeling better, reports some cough, she denies dyspnea or fever. ?Objective: ?Vitals:  ? 12/05/21 0000 12/05/21 0532 12/05/21 0812 12/05/21 1144  ?BP: 121/90 (!) 111/42 107/78 131/61  ?Pulse: 67 65 74 61  ?Resp: '14 18 19 12  '$ ?Temp: 98.6 ?F (37 ?C)  97.6 ?F (36.4 ?C) 97.6 ?F (36.4 ?C)  ?TempSrc: Oral  Axillary Axillary  ?SpO2: 94% 97%  98% 97%  ?Weight:      ?Height:      ? ? ?Intake/Output Summary (Last 24 hours) at 12/05/2021 1216 ?Last data filed at 12/05/2021 0500 ?Gross per 24 hour  ?Intake 41.33 ml  ?Output --  ?Net 41.33 ml  ? ?Filed Weights  ? 12/03/21 1341  ?Weight: 63.5 kg  ? ? ?Examination: ? ?Awake , she is  more confused today, restless, anxious. ?Symmetrical Chest wall movement, Good air movement bilaterally, CTAB ?RRR,No Gallops,Rubs or new Murmurs, No Parasternal Heave ?+ve B.Sounds, Abd Soft, No tenderness, No rebound - guarding or rigidity. ?No Cyanosis, Clubbing or edema, No new Rash or bruise   ? ? ? ?Data Reviewed: I have personally reviewed following labs and imaging studies ? ?CBC: ?Recent Labs  ?Lab 12/03/21 ?1409 12/04/21 ?7619 12/05/21 ?0201  ?WBC 9.6 6.9 5.2  ?NEUTROABS 7.7 4.7 4.1  ?HGB 14.4 12.9 13.0  ?HCT 43.7 39.0 38.6  ?MCV 88.6 88.8 87.5  ?PLT 240 201 185  ? ? ?Basic Metabolic Panel: ?Recent Labs  ?Lab 12/03/21 ?1409 12/04/21 ?5093 12/05/21 ?0201  ?NA 130* 133* 130*  ?K 4.0 3.9 4.1  ?CL 94* 100 98  ?CO2 '23 23 22  '$ ?GLUCOSE 151* 107* 165*  ?BUN 34* 41* 49*  ?CREATININE 1.56* 1.67* 1.85*  ?CALCIUM 8.9 8.5* 8.1*  ? ? ?GFR: ?Estimated Creatinine Clearance: 18.5 mL/min (A) (by C-G formula based on SCr of 1.85 mg/dL (H)). ? ?Liver Function Tests: ?Recent Labs  ?Lab 12/04/21 ?2671 12/05/21 ?0201  ?AST 59* 62*  ?ALT 20 22  ?ALKPHOS 54 45  ?BILITOT 0.4 0.5  ?PROT 5.8* 5.2*  ?ALBUMIN 2.4* 2.2*  ? ? ?CBG: ?Recent Labs  ?Lab 12/04/21 ?1118 12/04/21 ?1720 12/04/21 ?2117 12/05/21 ?2458 12/05/21 ?1142  ?GLUCAP 125* 106* 187* 145* 191*  ? ? ? ?Recent Results (from the past 240 hour(s))  ?Resp Panel by RT-PCR (Flu A&B, Covid) Nasopharyngeal Swab     Status: Abnormal  ? Collection Time: 12/03/21  2:30 PM  ? Specimen: Nasopharyngeal Swab; Nasopharyngeal(NP) swabs in vial transport medium  ?Result Value Ref Range Status  ? SARS Coronavirus 2 by RT PCR POSITIVE (A) NEGATIVE Final  ?  Comment: (NOTE) ?SARS-CoV-2 target nucleic acids are DETECTED. ? ?The SARS-CoV-2 RNA is generally detectable in upper respiratory ?specimens during the acute phase of infection. Positive results are ?indicative of the presence of the identified virus, but do not rule ?out bacterial infection or co-infection with other pathogens not ?detected  by the test. Clinical correlation with patient history and ?other diagnostic information is necessary to determine patient ?infection status. The expected result is Negative. ? ?Fact Sheet for Patients: ?EntrepreneurPulse.com.au ? ?Fact Sheet for Healthcare Providers: ?IncredibleEmployment.be ? ?This test is not yet approved or cleared by the [place] FDA and  ?has been authorized for detection and/or diagnosis of SARS-CoV-2 by ?FDA under an Emergency Use Authorization (EUA).  This EUA will ?remain in effect (meaning this test can be used) for the duration of  ?the COVID-19 declaration under Section 564(b)(1) of the A ct, 21 ?U.S.C. section 360bbb-3(b)(1), unless the authorization is ?terminated or revoked sooner. ? ?  ? Influenza A by PCR NEGATIVE NEGATIVE Final  ? Influenza B by PCR NEGATIVE NEGATIVE Final  ?  Comment: (NOTE) ?The Xpert Xpress SARS-CoV-2/FLU/RSV plus assay is intended as an aid ?in the diagnosis of influenza from Nasopharyngeal swab specimens and ?should not be used as a sole basis for treatment. Nasal washings and ?aspirates are unacceptable for Xpert Xpress  SARS-CoV-2/FLU/RSV ?testing. ? ?Fact Sheet for Patients: ?EntrepreneurPulse.com.au ? ?Fact Sheet for Healthcare Providers: ?IncredibleEmployment.be ? ?This test is not yet approved or cleared by the [place] FDA and ?has been authorized for detection and/or diagnosis of SARS-CoV-2 by ?FDA under an Emergency Use Authorization (EUA). This EUA will remain ?in effect (meaning this test can be used) for the duration of the ?COVID-19 declaration under Section 564(b)(1) of the Act, 21 U.S.C. ?section 360bbb-3(b)(1), unless the authorization is terminated or ?revoked. ? ?Performed at [facility], [place] [address], [place], [place] ?[address] ?  ?Urine Culture     Status: Abnormal (Preliminary result)  ? Collection Time: 12/04/21  6:15 AM  ? Specimen: Urine, Clean  Catch  ?Result Value Ref Range Status  ? Specimen Description URINE, CLEAN CATCH  Final  ? Special Requests   Final  ?  NONE ?Performed at [facility], [place] [address], [place], [place] [address] ?

## 2021-12-05 NOTE — TOC Initial Note (Addendum)
Transition of Care (TOC) - Initial/Assessment Note  ? ? ?Patient Details  ?Name: Brianna Carr ?MRN: [id number] ?Date of Birth: 1931-01-20 ? ?Transition of Care (TOC) CM/SW Contact:    ?Cyndi Bender, RN ?Phone Number: ?12/05/2021, 2:13 PM ? ?Clinical Narrative:                ?Spoke to daughter, [name], regarding transition needs. Patient lives alone but daughter will stay with her. Orders for home health PT/OT/aide. Daughter deferred to CM to find highly rated agency. [name] with [facility] accepted referral. Order for tub transfer bench. Daughter requested bench be delivered to the home. Patient has RW ?[name] with [employer] notified. ?Updated PCP information. Address and contact information confirmed.  ?TOC to continue to follow for needs.  ? ?Expected Discharge Plan: [place] ?Barriers to Discharge: Continued Medical Work up ? ? ?Patient Goals and CMS Choice ?Patient states their goals for this hospitalization and ongoing recovery are:: go home ?CMS Medicare.gov Compare Post Acute Care list provided to:: Patient Represenative (must comment) (daughter) ?Choice offered to / list presented to : Adult Children ? ?Expected Discharge Plan and Services ?Expected Discharge Plan: [place] ?  ?Discharge Planning Services: CM Consult ?Post Acute Care Choice: Home Health, Durable Medical Equipment ?Living arrangements for the past 2 months: [place] ?                ?DME Arranged: Other see comment (transfer tub bench) ?DME Agency: [facility] ?Date DME Agency Contacted: 12/05/21 ?Time DME Agency Contacted: [phone] ?Representative spoke with at DME Agency: [name] ?HH Arranged: PT, OT, Nurse's Aide ?[place] Agency: [place] ?Date HH Agency Contacted: 12/05/21 ?Time [place]: 1412 ?Representative spoke with at [place]: [name] ? ?Prior Living Arrangements/Services ?Living arrangements for the past 2 months: [place] ?Lives with:: Self ?Patient language and  need for interpreter reviewed:: Yes ?Do you feel safe going back to the place where you live?: Yes      ?Need for Family Participation in Patient Care: Yes (Comment) ?Care giver support system in place?: Yes (comment) ?Current home services: DME (walker) ?Criminal Activity/Legal Involvement Pertinent to Current Situation/Hospitalization: No - Comment as needed ? ?Activities of Daily Living ?Home Assistive Devices/Equipment: Gilford Rile (specify type) ?ADL Screening (condition at time of admission) ?Patient's cognitive ability adequate to safely complete daily activities?: No ?Is the patient deaf or have difficulty hearing?: No ?Does the patient have difficulty seeing, even when wearing glasses/contacts?: No ?Does the patient have difficulty concentrating, remembering, or making decisions?: Yes ?Patient able to express need for assistance with ADLs?: No ?Does the patient have difficulty dressing or bathing?: No ?Independently performs ADLs?: No ?Communication: Independent ?Dressing (OT): Needs assistance ?Is this a change from baseline?: Pre-admission baseline ?Grooming: Needs assistance ?Is this a change from baseline?: Pre-admission baseline ?Feeding: Independent ?Bathing: Needs assistance ?Is this a change from baseline?: Pre-admission baseline ?Toileting: Needs assistance ?Is this a change from baseline?: Pre-admission baseline ?In/Out Bed: Independent with device (comment) ?Walks in Home: Independent with device (comment) ?Does the patient have difficulty walking or climbing stairs?: Yes ?Weakness of Legs: Both ?Weakness of Arms/Hands: None ? ?Permission Sought/Granted ?  ?Permission granted to share information with : Yes, Verbal Permission Granted ?   ? Permission granted to share info w AGENCY: home health, DME ?   ?   ? ?Emotional Assessment ?  ?  ?  ?Orientation: : Fluctuating Orientation (Suspected and/or reported [place]) ?Alcohol /  Substance Use: Not Applicable ?Psych Involvement: No (comment) ? ?Admission  diagnosis:  AKI (acute kidney injury) ([place]) [N17.9] ?Fall, initial encounter [W19.XXXA] ?COVID-19 [U07.1] ?Patient Active Problem List  ? Diagnosis Date Noted  ? Rhabdomyolysis 12/05/2021  ? AKI (acute kidney injury) ([place]) 12/05/2021  ? COVID-19 12/03/2021  ? Dementia without behavioral disturbance ([place]) 12/03/2021  ? Diabetes mellitus without complication ([place]) 46/19/0122  ? Hypothyroidism (acquired) 12/03/2021  ? Essential hypertension 12/03/2021  ? Chronic kidney disease, stage 3b ([place]) 12/03/2021  ? Dyslipidemia 12/03/2021  ? ?PCP:  Pcp, No ?Pharmacy:   ?[id number] - [place], [address] ?[place] [address] ?Phone: [phone] Fax: [phone] ? ? ? ? ?Social Determinants of Health (SDOH) Interventions ?  ? ?Readmission Risk Interventions ?No flowsheet data found. ? ? ?

## 2021-12-05 NOTE — Discharge Instructions (Signed)
[facility] Stay ?Proper nutrition can help your body recover from illness and injury.   ?Foods and beverages high in protein, vitamins, and minerals help rebuild muscle loss, promote healing, & reduce fall risk.  ? ?In addition to eating healthy foods, a nutrition shake is an easy, delicious way to get the nutrition you need during and after your hospital stay ? ?It is recommended that you continue to drink 2 bottles per day of:  Ensure Plus for at least 1 month (30 days) after your hospital stay  ? ?Tips for adding a nutrition shake into your routine: ?As allowed, drink one with vitamins or medications instead of water or juice ?Enjoy one as a tasty mid-morning or afternoon snack ?Drink cold or make a milkshake out of it ?Drink one instead of milk with cereal or snacks ?Use as a coffee creamer ?  ?Available at the following grocery stores and pharmacies:           ?* [place]  ?* [facility]          * [facility]  ?* [facility]      * Target  * [employer]J's   ?* [facility]  * [employer]   ?[facility] [phone]  ?          ?For COUPONS visit: [email or web address]/join or [email or web address]  ? ?Suggested Substitutions ?Ensure Plus = Boost Plus = Carnation Breakfast Essentials = Boost Compact ?Glucerna Shake = Boost Glucose Control = Carnation Breakfast Essentials SUGAR FREE ? ?  ? ?

## 2021-12-06 DIAGNOSIS — E440 Moderate protein-calorie malnutrition: Secondary | ICD-10-CM | POA: Insufficient documentation

## 2021-12-06 LAB — COMPREHENSIVE METABOLIC PANEL
ALT: 33 U/L (ref 0–44)
AST: 92 U/L — ABNORMAL HIGH (ref 15–41)
Albumin: 2.4 g/dL — ABNORMAL LOW (ref 3.5–5.0)
Alkaline Phosphatase: 45 U/L (ref 38–126)
Anion gap: 14 (ref 5–15)
BUN: 50 mg/dL — ABNORMAL HIGH (ref 8–23)
CO2: 18 mmol/L — ABNORMAL LOW (ref 22–32)
Calcium: 8.2 mg/dL — ABNORMAL LOW (ref 8.9–10.3)
Chloride: 101 mmol/L (ref 98–111)
Creatinine, Ser: 1.38 mg/dL — ABNORMAL HIGH (ref 0.44–1.00)
GFR, Estimated: 37 mL/min — ABNORMAL LOW (ref >60)
Glucose, Bld: 153 mg/dL — ABNORMAL HIGH (ref 70–99)
Potassium: 4.5 mmol/L (ref 3.5–5.1)
Sodium: 133 mmol/L — ABNORMAL LOW (ref 135–145)
Total Bilirubin: 0.8 mg/dL (ref 0.3–1.2)
Total Protein: 5.6 g/dL — ABNORMAL LOW (ref 6.5–8.1)

## 2021-12-06 LAB — CBC WITH DIFFERENTIAL/PLATELET
Abs Immature Granulocytes: 0.07 10*3/uL (ref 0.00–0.07)
Basophils Absolute: 0 10*3/uL (ref 0.0–0.1)
Basophils Relative: 0 %
Eosinophils Absolute: 0 10*3/uL (ref 0.0–0.5)
Eosinophils Relative: 0 %
HCT: 40.4 % (ref 36.0–46.0)
Hemoglobin: 13.7 g/dL (ref 12.0–15.0)
Immature Granulocytes: 1 %
Lymphocytes Relative: 9 %
Lymphs Abs: 0.6 10*3/uL — ABNORMAL LOW (ref 0.7–4.0)
MCH: 29.4 pg (ref 26.0–34.0)
MCHC: 33.9 g/dL (ref 30.0–36.0)
MCV: 86.7 fL (ref 80.0–100.0)
Monocytes Absolute: 0.6 10*3/uL (ref 0.1–1.0)
Monocytes Relative: 9 %
Neutro Abs: 5.6 10*3/uL (ref 1.7–7.7)
Neutrophils Relative %: 81 %
Platelets: 162 10*3/uL (ref 150–400)
RBC: 4.66 MIL/uL (ref 3.87–5.11)
RDW: 13.2 % (ref 11.5–15.5)
WBC: 6.9 10*3/uL (ref 4.0–10.5)
nRBC: 0 % (ref 0.0–0.2)

## 2021-12-06 LAB — GLUCOSE, CAPILLARY
Glucose-Capillary: 147 mg/dL — ABNORMAL HIGH (ref 70–99)
Glucose-Capillary: 120 mg/dL — ABNORMAL HIGH (ref 70–99)
Glucose-Capillary: 136 mg/dL — ABNORMAL HIGH (ref 70–99)
Glucose-Capillary: 97 mg/dL (ref 70–99)

## 2021-12-06 LAB — URINE CULTURE: Culture: >=100000 — AB

## 2021-12-06 MED ORDER — LORAZEPAM 2 MG/ML IJ SOLN
0.5000 mg | Freq: Once | INTRAMUSCULAR | Status: AC | PRN
  Administered 2021-12-06: 0.5 mg via INTRAVENOUS
  Filled 2021-12-06: qty 1

## 2021-12-06 MED ORDER — LORAZEPAM 2 MG/ML IJ SOLN: 1.0000 mg | Freq: Once | INTRAMUSCULAR | Status: DC | PRN

## 2021-12-06 MED ORDER — QUETIAPINE FUMARATE 25 MG PO TABS
25.0000 mg | ORAL_TABLET | Freq: Every day | ORAL | Status: DC
  Administered 2021-12-06: 25 mg via ORAL
  Filled 2021-12-06: qty 1

## 2021-12-06 NOTE — Progress Notes (Addendum)
Occupational Therapy Treatment Patient Details Name: Brianna Carr MRN: [id number] DOB: 1930/12/28 Today's Date: 12/06/2021   History of present illness 88 yo female presenting to ED on 3/12 with a fall. Tested COVID +. PMH including DM, dementia, and hypothyroidism.   OT comments  Pt requiring increased physical assist for ADLs/transfers today. Pt limited d/t posterior lean in sitting/standing and delirium (perseverating on getting ready for church) requiring frequent redirection. Pt requires Min-Mod A for sit to stand transfers and side steps at bedside with consistent hands on assist to correct posterior bias. Pt requires Mod A for LB dressing tasks with similar posterior LOB noted sitting EOB. Anticipate pt's mental status may improve at home in familiar environment. However, would recommend DC home with HHOT only if 24/7 support available at DC as pt currently requires hands on assist for basic ADLs/transfers.    Recommendations for follow up therapy are one component of a multi-disciplinary discharge planning process, led by the attending physician.  Recommendations may be updated based on patient status, additional functional criteria and insurance authorization.    Follow Up Recommendations  Home health OT (pending 24/7 physical assist availability)    Assistance Recommended at Discharge Frequent or constant Supervision/Assistance  Patient can return home with the following  A lot of help with walking and/or transfers;A lot of help with bathing/dressing/bathroom;Assistance with cooking/housework;Direct supervision/assist for medications management;Direct supervision/assist for financial management;Help with stairs or ramp for entrance;Assist for transportation   Equipment Recommendations  Tub/shower seat    Recommendations for Other Services      Precautions / Restrictions Precautions Precautions: Fall Restrictions Weight Bearing Restrictions: No       Mobility Bed  Mobility Overal bed mobility: Needs Assistance Bed Mobility: Supine to Sit, Sit to Supine     Supine to sit: Min assist, HOB elevated Sit to supine: Mod assist   General bed mobility comments: Min A to lift trunk with handheld assist and increased time to initiate task. Mod A to get B LE back in bed with light assist to reposition trunk in bed    Transfers Overall transfer level: Needs assistance Equipment used: Rolling walker (2 wheels) Transfers: Sit to/from Stand Sit to Stand: Min assist           General transfer comment: Min A to correct posture and acheive upright position; noted posterior lean and difficulty with sidesteps at bedside     Balance Overall balance assessment: Needs assistance Sitting-balance support: No upper extremity supported, Feet supported Sitting balance-Leahy Scale: Fair Sitting balance - Comments: posterior LOB with dynamic tasks Postural control: Posterior lean Standing balance support: During functional activity, Bilateral upper extremity supported Standing balance-Leahy Scale: Poor Standing balance comment: reliant on UE support in standing                           ADL either performed or assessed with clinical judgement   ADL Overall ADL's : Needs assistance/impaired     Grooming: Min guard;Sitting;Wash/dry face;Wash/dry hands Grooming Details (indicate cue type and reason): posterior lean sitting EOB. cues and min guard to correct             Lower Body Dressing: Moderate assistance;Sit to/from stand;Sitting/lateral leans Lower Body Dressing Details (indicate cue type and reason): able to don L sock using figure four position though increasing posterior LOB noted. Assisted with R sock mgmt due to poor balance  General ADL Comments: Pt with baseline cognitive impairments,  noted posterior lean in sitting and standing that increases fall risk and unable to mobilize away from bed for ADLs as planned     Extremity/Trunk Assessment Upper Extremity Assessment Upper Extremity Assessment: Generalized weakness   Lower Extremity Assessment Lower Extremity Assessment: Defer to PT evaluation        Vision   Vision Assessment?: No apparent visual deficits   Perception     Praxis      Cognition Arousal/Alertness: Awake/alert Behavior During Therapy: WFL for tasks assessed/performed Overall Cognitive Status: History of cognitive impairments - at baseline Area of Impairment: Orientation, Safety/judgement, Problem solving, Awareness, Memory, Following commands, Attention                 Orientation Level: Disoriented to, Situation, Time, Place Current Attention Level: Sustained Memory: Decreased short-term memory Following Commands: Follows one step commands with increased time Safety/Judgement: Decreased awareness of safety, Decreased awareness of deficits Awareness: Emergent Problem Solving: Requires verbal cues, Difficulty sequencing General Comments: Pt with baseline dementia. may be close to baseline. Follwing commands. conversational and very agreeable to therapy though noted decreased awareness of safety/deficits. Pt perseverating on need to get ready and go to church. emphasis on reorientation to situation gently with moments of clarity noted (educated on COVID+, why staff wearing PPE with pt exclaiming "oh, im so sorry (you have to wear that)"; able to notice balance deficits and report "maybe I shouldnt go to church today" but d/t short term memory deficits; frequent redirection and reorientation noted. Pt was noted to look to L corner of room and call out for Molly Maduro (her son) though no family present        Exercises      Shoulder Instructions       General Comments VSS on RA. Safety sitter present initially but stepped out during OT session, educa to place bed alarm on if sitter not present by end of OT session (NT/secretary aware)    Pertinent Vitals/ Pain        Pain Assessment Pain Assessment: No/denies pain  Home Living                                          Prior Functioning/Environment              Frequency  Min 2X/week        Progress Toward Goals  OT Goals(current goals can now be found in the care plan section)  Progress towards OT goals: OT to reassess next treatment  Acute Rehab OT Goals Patient Stated Goal: go to church OT Goal Formulation: With patient Time For Goal Achievement: 12/18/21 Potential to Achieve Goals: Good ADL Goals Pt Will Perform Grooming: with supervision;standing Pt Will Transfer to Toilet: with supervision;ambulating;regular height toilet Pt Will Perform Toileting - Clothing Manipulation and hygiene: with supervision;sitting/lateral leans;sit to/from stand Additional ADL Goal #1: Pt will demonstrate increased activity tolerance to perform three grooming tasks standing at sink with Supervision  Plan Discharge plan remains appropriate    Co-evaluation                 AM-PAC OT "6 Clicks" Daily Activity     Outcome Measure   Help from another person eating meals?: A Little Help from another person taking care of personal grooming?: A Little Help from another person toileting, which includes using  toliet, bedpan, or urinal?: A Little Help from another person bathing (including washing, rinsing, drying)?: A Little Help from another person to put on and taking off regular upper body clothing?: A Little Help from another person to put on and taking off regular lower body clothing?: A Little 6 Click Score: 18    End of Session Equipment Utilized During Treatment: Rolling walker (2 wheels);Gait belt  OT Visit Diagnosis: Unsteadiness on feet (R26.81);Other abnormalities of gait and mobility (R26.89);Muscle weakness (generalized) (M62.81)   Activity Tolerance Patient tolerated treatment well   Patient Left in bed;with call bell/phone within reach;with bed alarm set    Nurse Communication Mobility status        Time: 1610-9604 OT Time Calculation (min): 37 min  Charges: OT General Charges $OT Visit: 1 Visit OT Treatments $Self Care/Home Management : 8-22 mins $Therapeutic Activity: 8-22 mins  Bradd Canary, OTR/L Acute Rehab Services Office: [phone]   Lorre Munroe 12/06/2021, 1:36 PM

## 2021-12-06 NOTE — Progress Notes (Signed)
?      ?                 PROGRESS NOTE ? ?      ?PATIENT DETAILS ?Name: Brianna Carr ?Age: 88 y.o. ?Sex: female ?Date of Birth: 27-Mar-1931 ?Admit Date: 12/03/2021 ?Admitting Physician Karmen Bongo, MD ?BTD:VVOH, [name], NP ? ?Brief Summary: ?Patient is a 88 y.o.  female with history of dementia, DM-2, hypothyroidism CKD stage IIIa-who was recently diagnosed with COVID-19 infection-presented with a fall-she was found to have AKI and subsequently admitted to the hospitalist service.  See below for further details. ? ? ?Significant events: ?3/12>> admit to [facility] for AKI-recently diagnosed with COVID-19 infection. ? ?Significant studies: ?3/12>> CT head: No acute intracranial findings ?3/12>> CT C-spine: No acute cervical spine findings. ?3/12>> CXR: No PNA ?3/12>> x-ray pelvis: Negative for traumatic injury ? ?Significant microbiology data: ?3/13>> urine culture: E. Coli ?3/12>> influenza PCR: Negative ? ?Procedures: ?None ? ?Consults: ?None  ? ?Subjective: ?Awake-still confused but able to answer simple questions.  Having some visual hallucinations as well. ? ?Objective: ?Vitals: ?Blood pressure 129/70, pulse 78, temperature 98.2 ?F (36.8 ?C), temperature source Axillary, resp. rate 19, height '5\' 3"'$  (1.6 m), weight 63.5 kg, SpO2 98 %.  ? ?Exam: ?Gen Exam: Not in any distress. ?HEENT:atraumatic, normocephalic ?Chest: B/L clear to auscultation anteriorly ?CVS:S1S2 regular ?Abdomen:soft non tender, non distended ?Extremities:no edema ?Neurology: Difficult exam but appears nonfocal. ?Skin: no rash ? ?Pertinent Labs/Radiology: ?CBC Latest Ref Rng & Units 12/06/2021 12/05/2021 12/04/2021  ?WBC 4.0 - 10.5 K/uL 6.9 5.2 6.9  ?Hemoglobin 12.0 - 15.0 g/dL 13.7 13.0 12.9  ?Hematocrit 36.0 - 46.0 % 40.4 38.6 39.0  ?Platelets 150 - 400 K/uL 162 185 201  ?  ?Lab Results  ?Component Value Date  ? NA 133 (L) 12/06/2021  ? K 4.5 12/06/2021  ? CL 101 12/06/2021  ? CO2 18 (L) 12/06/2021  ?  ? ? ?Assessment/Plan: ?AKI on CKD stage IIIa: AKI  likely hemodynamically mediated-slowly improving-and trending towards baseline.  Continue to avoid nephrotoxic agents. ? ?COVID-19 infection: Remains stable on room air-no PNA on CXR-continue molnupiravir.  We will go and stop steroids at this point-as it could worsen her delirium. ? ?E. coli UTI: Unclear whether she is symptomatic-continue Rocephin for a few more days. ? ?Dementia with delirium: Supportive care-sitter in place-given that she has acute illness requiring hospitalizations-suspect she will continue to exhibit delirium as long as she is hospitalized.  Stopping steroids-add low-dose Seroquel nightly.  Continue to utilize IV Haldol as needed. ? ?HLD: Continue statin ? ?HTN: BP stable on bisoprolol, HCTZ/Diovan on hold. ? ?DM-2: CBG stable with SSI.  Resume metformin on discharge. ? ?Recent Labs  ?  12/05/21 ?1601 12/05/21 ?2057 12/06/21 ?0829  ?GLUCAP 135* 159* 147*  ?  ? ?Hypothyroidism: Continue Synthroid ? ?Nutrition Status: ?Nutrition Problem: Moderate Malnutrition ?Etiology: chronic illness (Dementia) ?Signs/Symptoms: moderate muscle depletion, mild fat depletion ?Interventions: Ensure Enlive (each supplement provides 350kcal and 20 grams of protein), MVI, Liberalize Diet ? ?BMI: ?Estimated body mass index is 24.8 kg/m? as calculated from the following: ?  Height as of this encounter: '5\' 3"'$  (1.6 m). ?  Weight as of this encounter: 63.5 kg.  ? ?Code status: ?  Code Status: Full Code  ? ?DVT Prophylaxis: ?enoxaparin (LOVENOX) injection 30 mg Start: 12/03/21 1730 ?  ?Family Communication: Daughter-Lauren [phone]-updated over the phone on 3/15 ? ? ?Disposition Plan: ?Status is: Inpatient ?Remains inpatient appropriate because: Improving kidney function-ongoing delirium-not  yet stable for discharge.  Probable discharge in the next 1-2 days of improvement continues. ?  ?Planned Discharge Destination:Home health ? ? ?Diet: ?Diet Order   ? ?       ?  Diet regular Room service appropriate? Yes  with Assist; Fluid consistency: Thin  Diet effective now       ?  ? ?  ?  ? ?  ?  ? ? ?Antimicrobial agents: ?Anti-infectives (From admission, onward)  ? ? Start     Dose/Rate Route Frequency Ordered Stop  ? 12/04/21 1000  molnupiravir EUA (LAGEVRIO) capsule 800 mg  Status:  Discontinued       ? 4 capsule Oral 2 times daily 12/04/21 0746 12/04/21 0746  ? 12/04/21 1000  molnupiravir EUA (LAGEVRIO) capsule 800 mg       ? 4 capsule Oral 2 times daily 12/04/21 0748 12/09/21 0959  ? 12/04/21 0800  cefTRIAXone (ROCEPHIN) 1 g in sodium chloride 0.9 % 100 mL IVPB       ? 1 g ?200 mL/hr over 30 Minutes Intravenous Every 24 hours 12/04/21 0746 12/09/21 0759  ? ?  ? ? ? ?MEDICATIONS: ?Scheduled Meds: ? vitamin C  500 mg Oral Daily  ? bisoprolol  5 mg Oral Daily  ? dexamethasone (DECADRON) injection  6 mg Intravenous Q24H  ? docusate sodium  100 mg Oral BID  ? enoxaparin (LOVENOX) injection  30 mg Subcutaneous Q24H  ? feeding supplement  237 mL Oral BID BM  ? insulin aspart  0-15 Units Subcutaneous TID WC  ? levothyroxine  100 mcg Oral Q0600  ? molnupiravir EUA  4 capsule Oral BID  ? multivitamin with minerals  1 tablet Oral Daily  ? sodium chloride flush  3 mL Intravenous Q12H  ? sodium chloride flush  3 mL Intravenous Q12H  ? zinc sulfate  220 mg Oral Daily  ? ?Continuous Infusions: ? sodium chloride    ? cefTRIAXone (ROCEPHIN)  IV 1 g (12/06/21 0454)  ? ?PRN Meds:.sodium chloride, acetaminophen, albuterol, bisacodyl, guaiFENesin-dextromethorphan, haloperidol lactate, ondansetron **OR** ondansetron (ZOFRAN) IV, polyethylene glycol, sodium chloride flush, traMADol ? ? ?I have personally reviewed following labs and imaging studies ? ?LABORATORY DATA: ?CBC: ?Recent Labs  ?Lab 12/03/21 ?1409 12/04/21 ?0981 12/05/21 ?0201 12/06/21 ?0454  ?WBC 9.6 6.9 5.2 6.9  ?NEUTROABS 7.7 4.7 4.1 5.6  ?HGB 14.4 12.9 13.0 13.7  ?HCT 43.7 39.0 38.6 40.4  ?MCV 88.6 88.8 87.5 86.7  ?PLT 240 201 185 162  ? ? ?Basic Metabolic Panel: ?Recent Labs  ?Lab  12/03/21 ?1409 12/04/21 ?1914 12/05/21 ?0201 12/06/21 ?0454  ?NA 130* 133* 130* 133*  ?K 4.0 3.9 4.1 4.5  ?CL 94* 100 98 101  ?CO2 '23 23 22 '$ 18*  ?GLUCOSE 151* 107* 165* 153*  ?BUN 34* 41* 49* 50*  ?CREATININE 1.56* 1.67* 1.85* 1.38*  ?CALCIUM 8.9 8.5* 8.1* 8.2*  ? ? ?GFR: ?Estimated Creatinine Clearance: 24.8 mL/min (A) (by C-G formula based on SCr of 1.38 mg/dL (H)). ? ?Liver Function Tests: ?Recent Labs  ?Lab 12/04/21 ?7829 12/05/21 ?0201 12/06/21 ?0454  ?AST 59* 62* 92*  ?ALT 20 22 33  ?ALKPHOS 54 45 45  ?BILITOT 0.4 0.5 0.8  ?PROT 5.8* 5.2* 5.6*  ?ALBUMIN 2.4* 2.2* 2.4*  ? ?No results for input(s): LIPASE, AMYLASE in the last 168 hours. ?No results for input(s): AMMONIA in the last 168 hours. ? ?Coagulation Profile: ?No results for input(s): INR, PROTIME in the last 168 hours. ? ?Cardiac Enzymes: ?Recent Labs  ?  Lab 12/03/21 ?1409 12/04/21 ?7903 12/05/21 ?0201  ?CKTOTAL 839* 553* 626*  ? ? ?BNP (last 3 results) ?No results for input(s): PROBNP in the last 8760 hours. ? ?Lipid Profile: ?No results for input(s): CHOL, HDL, LDLCALC, TRIG, CHOLHDL, LDLDIRECT in the last 72 hours. ? ?Thyroid Function Tests: ?No results for input(s): TSH, T4TOTAL, FREET4, T3FREE, THYROIDAB in the last 72 hours. ? ?Anemia Panel: ?No results for input(s): VITAMINB12, FOLATE, FERRITIN, TIBC, IRON, RETICCTPCT in the last 72 hours. ? ?Urine analysis: ?   ?Component Value Date/Time  ? COLORURINE AMBER (A) 12/04/2021 0615  ? APPEARANCEUR CLOUDY (A) 12/04/2021 0615  ? LABSPEC 1.017 12/04/2021 0615  ? PHURINE 5.0 12/04/2021 0615  ? [place] NEGATIVE 12/04/2021 0615  ? [place] NEGATIVE 12/04/2021 0615  ? [place] NEGATIVE 12/04/2021 0615  ? [place] NEGATIVE 12/04/2021 0615  ? PROTEINUR 30 (A) 12/04/2021 0615  ? NITRITE NEGATIVE 12/04/2021 0615  ? LEUKOCYTESUR MODERATE (A) 12/04/2021 0615  ? ? ?Sepsis Labs: ?Lactic Acid, Venous ?No results found for: LATICACIDVEN ? ?MICROBIOLOGY: ?Recent Results (from the past 240 hour(s))  ?Resp Panel by  RT-PCR (Flu A&B, Covid) Nasopharyngeal Swab     Status: Abnormal  ? Collection Time: 12/03/21  2:30 PM  ? Specimen: Nasopharyngeal Swab; Nasopharyngeal(NP) swabs in vial transport medium  ?Result Value R

## 2021-12-06 NOTE — Progress Notes (Signed)
Physical Therapy Treatment ?Patient Details ?Name: Brianna Carr ?MRN: [id number] ?DOB: 11-16-1930 ?Today's Date: 12/06/2021 ? ? ?History of Present Illness 88 yo female presenting to ED on 3/12 with a fall. Tested COVID +. PMH including DM, dementia, and hypothyroidism. ? ?  ?PT Comments  ? ? Patient with posterior lean in sitting, standing, and during functional mobility. Patient able to correct initial posterior lean in standing but with short ambulation resulted in posterior LOB requiring modA to recover. Patient require min-modA for transfers and ambulation. Patient with hx of dementia and seems to be at baseline. Recommend d/c home with 24/7 supervision/assistance and HHPT to maximize functional mobility and anticipate patient will benefit from familiar environment.  ?  ?Recommendations for follow up therapy are one component of a multi-disciplinary discharge planning process, led by the attending physician.  Recommendations may be updated based on patient status, additional functional criteria and insurance authorization. ? ?Follow Up Recommendations ? Home health PT ?  ?  ?Assistance Recommended at Discharge Frequent or constant Supervision/Assistance  ?Patient can return home with the following A little help with walking and/or transfers;A little help with bathing/dressing/bathroom;Assistance with cooking/housework;Direct supervision/assist for medications management;Assist for transportation;Help with stairs or ramp for entrance ?  ?Equipment Recommendations ? Rolling Jeffrey Voth (2 wheels)  ?  ?Recommendations for Other Services   ? ? ?  ?Precautions / Restrictions Precautions ?Precautions: Fall ?Restrictions ?Weight Bearing Restrictions: No  ?  ? ?Mobility ? Bed Mobility ?Overal bed mobility: Needs Assistance ?Bed Mobility: Supine to Sit ?  ?  ?Supine to sit: Min assist ?  ?  ?General bed mobility comments: minA for trunk elevation ?  ? ?Transfers ?Overall transfer level: Needs assistance ?Equipment used:  Rolling Lucretia Pendley (2 wheels) ?Transfers: Sit to/from Stand ?Sit to Stand: Min assist ?  ?  ?  ?  ?  ?General transfer comment: use of momentum and assist to come into standing. Initially with posterior lean but quickly corrected. ?  ? ?Ambulation/Gait ?Ambulation/Gait assistance: Min assist, Mod assist ?Gait Distance (Feet): 5 Feet ?Assistive device: Rolling Andris Brothers (2 wheels) ?Gait Pattern/deviations: Step-through pattern, Decreased stride length, Shuffle ?Gait velocity: decreased ?  ?  ?General Gait Details: minA initially for taking steps forward but when turning towards recliner, patient with posterior LOB requiring modA to recover ? ? ?Stairs ?  ?  ?  ?  ?  ? ? ?Wheelchair Mobility ?  ? ?Modified Rankin (Stroke Patients Only) ?  ? ? ?  ?Balance Overall balance assessment: Needs assistance ?Sitting-balance support: No upper extremity supported, Feet supported ?Sitting balance-Leahy Scale: Fair ?Sitting balance - Comments: posterior LOB with dynamic tasks ?  ?Standing balance support: During functional activity, Bilateral upper extremity supported ?Standing balance-Leahy Scale: Poor ?Standing balance comment: reliant on UE support in standing ?  ?  ?  ?  ?  ?  ?  ?  ?  ?  ?  ?  ? ?  ?Cognition Arousal/Alertness: Awake/alert ?Behavior During Therapy: [facility] for tasks assessed/performed ?Overall Cognitive Status: History of cognitive impairments - at baseline ?Area of Impairment: Orientation, Safety/judgement, Problem solving, Awareness, Memory, Following commands, Attention ?  ?  ?  ?  ?  ?  ?  ?  ?Orientation Level: Disoriented to, Situation, Time, Place ?Current Attention Level: Sustained ?Memory: Decreased short-term memory ?Following Commands: Follows one step commands with increased time ?Safety/Judgement: Decreased awareness of safety, Decreased awareness of deficits ?Awareness: Emergent ?Problem Solving: Requires verbal cues, Difficulty sequencing ?General Comments: seems close to baseline  as patient has baseline  dementia ?  ?  ? ?  ?Exercises General Exercises - Lower Extremity ?Long Arc Quad: AROM, Both, 10 reps, Seated ?Hip Flexion/Marching: AROM, Both, 10 reps, Seated ?Toe Raises: AROM, Both, 10 reps, Seated ? ?  ?General Comments General comments (skin integrity, edema, etc.): VSS on RA. Safety sitter present initially but stepped out during OT session, educa to place bed alarm on if sitter not present by end of OT session (NT/secretary aware) ?  ?  ? ?Pertinent Vitals/Pain Pain Assessment ?Pain Assessment: No/denies pain  ? ? ?Home Living   ?  ?  ?  ?  ?  ?  ?  ?  ?  ?   ?  ?Prior Function    ?  ?  ?   ? ?PT Goals (current goals can now be found in the care plan section) Acute Rehab PT Goals ?Patient Stated Goal: return home independently ?PT Goal Formulation: With patient/family ?Time For Goal Achievement: 12/18/21 ?Potential to Achieve Goals: Good ?Progress towards PT goals: Progressing toward goals ? ?  ?Frequency ? ? ? Min 3X/week ? ? ? ?  ?PT Plan Current plan remains appropriate  ? ? ?Co-evaluation   ?  ?  ?  ?  ? ?  ?AM-PAC PT "6 Clicks" Mobility   ?Outcome Measure ? Help needed turning from your back to your side while in a flat bed without using bedrails?: None ?Help needed moving from lying on your back to sitting on the side of a flat bed without using bedrails?: A Little ?Help needed moving to and from a bed to a chair (including a wheelchair)?: A Little ?Help needed standing up from a chair using your arms (e.g., wheelchair or bedside chair)?: A Little ?Help needed to walk in hospital room?: A Little ?Help needed climbing 3-5 steps with a railing? : A Little ?6 Click Score: 19 ? ?  ?End of Session Equipment Utilized During Treatment: Gait belt ?Activity Tolerance: Patient tolerated treatment well ?Patient left: in chair;with call bell/phone within reach;with nursing/sitter in room Conservator, museum/gallery present) ?Nurse Communication: Mobility status ?PT Visit Diagnosis: Unsteadiness on feet (R26.81);History of  falling (Z91.81) ?  ? ? ?Time: 9417-4081 ?PT Time Calculation (min) (ACUTE ONLY): 25 min ? ?Charges:  $Therapeutic Exercise: 8-22 mins ?$Therapeutic Activity: 8-22 mins          ?          ? ?Lior Hoen A. Gilford Rile, PT, DPT ?Acute Rehabilitation Services ?Pager [phone] ?Office [phone] ? ? ? ?Maurya Nethery A Delquan Poucher ?12/06/2021, 4:57 PM ? ?

## 2021-12-07 ENCOUNTER — Other Ambulatory Visit (HOSPITAL_COMMUNITY): Payer: Self-pay

## 2021-12-07 LAB — BASIC METABOLIC PANEL
Anion gap: 12 (ref 5–15)
BUN: 46 mg/dL — ABNORMAL HIGH (ref 8–23)
CO2: 18 mmol/L — ABNORMAL LOW (ref 22–32)
Calcium: 8.1 mg/dL — ABNORMAL LOW (ref 8.9–10.3)
Chloride: 105 mmol/L (ref 98–111)
Creatinine, Ser: 1.18 mg/dL — ABNORMAL HIGH (ref 0.44–1.00)
GFR, Estimated: 44 mL/min — ABNORMAL LOW (ref >60)
Glucose, Bld: 96 mg/dL (ref 70–99)
Potassium: 4.6 mmol/L (ref 3.5–5.1)
Sodium: 135 mmol/L (ref 135–145)

## 2021-12-07 LAB — GLUCOSE, CAPILLARY: Glucose-Capillary: 102 mg/dL — ABNORMAL HIGH (ref 70–99)

## 2021-12-07 MED ORDER — MOLNUPIRAVIR EUA 200MG CAPSULE: 4.0000 | ORAL_CAPSULE | Freq: Two times a day (BID) | ORAL | 0 refills | Status: DC

## 2021-12-07 MED ORDER — BISOPROLOL FUMARATE 10 MG PO TABS
10.0000 mg | ORAL_TABLET | Freq: Every day | ORAL | 11 refills | Status: DC
  Filled 2021-12-07: qty 30, 30d supply, fill #0

## 2021-12-07 MED ORDER — MOLNUPIRAVIR EUA 200MG CAPSULE: 4.0000 | ORAL_CAPSULE | Freq: Two times a day (BID) | ORAL | 0 refills | Status: AC

## 2021-12-07 NOTE — Progress Notes (Cosign Needed)
Length of Need 6 Months  °The above medical condition requires: Patient requires the ability to reposition frequently  °Head must be elevated greater than: 30 degrees  °Bed type Semi-electric  °Support Surface: Gel Overlay  ° °

## 2021-12-07 NOTE — Progress Notes (Signed)
Notified Dr Marlowe Sax of continuing and worstening agitation, confusion, hallucinations. Haldol and seroquel appear to be ineffective for her agitation. 1:1 sitter is at bedside w/ continued attempts to redirect w/out success. She dug her fingernails in [place], NT arms and trying to kick at her. Alert to self only. She thinks she is at home and noted hallucinations w/ her talking to people that are not in the room. All attempts to redirect failed. Music therapy attempted as well.  ?See new orders. Continuing to monitor.  ?

## 2021-12-07 NOTE — Discharge Summary (Signed)
? ?PATIENT DETAILS ?Name: Brianna Carr ?Age: 88 y.o. ?Sex: female ?Date of Birth: 1931-06-16 ?MRN: [id number]. ?Admitting Physician: Brianna Carr ?Brianna Carr, Brianna Jersey, NP ? ?Admit Date: 12/03/2021 ?Discharge date: 12/07/2021 ? ?Recommendations for Outpatient Follow-up:  ?Follow up with PCP in 1-2 weeks ?Please obtain CMP/CBC in one week ? ?Admitted From:  ?Home ? ?Disposition: ?Home health ?  ?Discharge Condition: ?fair ? ?CODE STATUS: ?  Code Status: Full Code  ? ?Diet recommendation:  ?Diet Order   ? ?       ?  Diet - low sodium heart healthy       ?  ?  Diet regular Room service appropriate? Yes with Assist; Fluid consistency: Thin  Diet effective now       ?  ? ?  ?  ? ?  ?  ? ?Brief Summary: ?Patient is a 88 y.o.  female with history of dementia, DM-2, hypothyroidism CKD stage IIIa-who was recently diagnosed with COVID-19 infection-presented with a fall-she was found to have AKI and subsequently admitted to the hospitalist service.  See below for further details. ?  ?  ?Significant events: ?3/12>> admit to [facility] for AKI-recently diagnosed with COVID-19 infection. ?  ?Significant studies: ?3/12>> CT head: No acute intracranial findings ?3/12>> CT C-spine: No acute cervical spine findings. ?3/12>> CXR: No PNA ?3/12>> x-ray pelvis: Negative for traumatic injury ?  ?Significant microbiology data: ?3/13>> urine culture: E. Coli ?3/12>> influenza PCR: Negative ?  ?Procedures: ?None ?  ?Consults: ?None  ?  ? ?Brief Hospital Course: ?AKI on CKD stage IIIa: AKI likely hemodynamically mediated-renal function improved with supportive care and IVF.  Creatinine close to baseline.   ?  ?COVID-19 infection: Remains stable on room air-no PNA on CXR-continue molnupiravir.  ?  ?E. coli UTI: Unclear whether she is symptomatic-managed with Rocephin-has completed more than 3 days of treatment-do not think she requires any further antimicrobial therapy on discharge. ?  ?Dementia with delirium: Hospital course complicated by mild  delirium-specially in the evening/night hours-requiring initiation of Haldol/Seroquel/IV Ativan.  Family aware that as long as she is hospitalized and out of familiar surroundings-she will continue to have these episodes.  Long discussion with daughter on day of discharge-plan is to get her home today with home health to see if she will settle down when she is back to her family surroundings.  Per daughter-patient has had issues with delirium even at home.   ?  ?HLD: Continue statin ?  ?HTN: BP stable-but slowly creeping up-we will increase bisoprolol to 10 mg on discharge.  Continue to hold valsartan and HCTZ.  PCP to reassess at next visit whether this can be resumed.  Given AKI-May be worth switching to another agent if a second agent is required.   ? ?DM-2: CBG stable with SSI.  Resume metformin on discharge. ? ?Hypothyroidism: Continue Synthroid ?  ?Nutrition Status: ?Nutrition Problem: Moderate Malnutrition ?Etiology: chronic illness (Dementia) ?Signs/Symptoms: moderate muscle depletion, mild fat depletion ?Interventions: Ensure Enlive (each supplement provides 350kcal and 20 grams of protein), MVI, Liberalize Diet ?  ?BMI: ?Estimated body mass index is 24.8 kg/m? as calculated from the following: ?  Height as of this encounter: '5\' 3"'$  (1.6 m). ?  Weight as of this encounter: 63.5 kg.  ?Pressure Ulcer: ?  ? ? ?Nutrition Status: ?Nutrition Problem: Moderate Malnutrition ?Etiology: chronic illness (Dementia) ?Signs/Symptoms: moderate muscle depletion, mild fat depletion ?Interventions: Ensure Enlive (each supplement provides 350kcal and 20 grams of protein), MVI, Liberalize Diet ?  ? ?  Obesity: ?Estimated body mass index is 24.8 kg/m? as calculated from the following: ?  Height as of this encounter: '5\' 3"'$  (1.6 m). ?  Weight as of this encounter: 63.5 kg.  ? ?RN pressure injury documentation: ?  ? ? ?Discharge Diagnoses:  ?Principal Problem: ?  COVID-19 ?Active Problems: ?  Dementia without behavioral disturbance  ([name]) ?  Diabetes mellitus without complication ([name]) ?  Hypothyroidism (acquired) ?  Essential hypertension ?  Chronic kidney disease, stage 3b ([name]) ?  Dyslipidemia ?  Rhabdomyolysis ?  AKI (acute kidney injury) ([name]) ?  Malnutrition of moderate degree ? ? ?Discharge Instructions: ? ?Activity:  ?As tolerated with Full fall precautions use walker/cane & assistance as needed ? ?Discharge Instructions   ? ? Call Carr for:  difficulty breathing, headache or visual disturbances   Complete by: As directed ?  ? Diet - low sodium heart healthy   Complete by: As directed ?  ? Discharge instructions   Complete by: As directed ?  ? Follow with Primary Carr  Brianna Pilling, NP in 1-2 weeks ? ?Your blood pressure medications were adjusted during this hospital stay-you will now just take bisoprolol at an increased dose.  Your blood pressure was stable in the hospital with just one blood pressure medication.  HCTZ/Diovan (other blood pressure medications he was on prior to this hospitalization) have been discontinued because of acute kidney injury.  Please ask your primary care practitioner about resuming these medications at next visit. ? ?Please get a complete blood count and chemistry panel checked by your Primary Carr at your next visit, and again as instructed by your Primary Carr. ? ?Get Medicines reviewed and adjusted: ?Please take all your medications with you for your next visit with your Primary Carr ? ?Laboratory/radiological data: ?Please request your Primary Carr to go over all hospital tests and procedure/radiological results at the follow up, please ask your Primary Carr to get all Hospital records sent to his/her office. ? ?In some cases, they will be blood work, cultures and biopsy results pending at the time of your discharge. Please request that your primary care M.D. follows up on these results. ? ?Also Note the following: ?If you experience worsening of your admission symptoms, develop shortness of breath, life threatening  emergency, suicidal or homicidal thoughts you must seek medical attention immediately by calling 911 or calling your Carr immediately  if symptoms less severe. ? ?You must read complete instructions/literature along with all the possible adverse reactions/side effects for all the Medicines you take and that have been prescribed to you. Take any new Medicines after you have completely understood and accpet all the possible adverse reactions/side effects.  ? ?Do not drive when taking Pain medications or sleeping medications (Benzodaizepines) ? ?Do not take more than prescribed Pain, Sleep and Anxiety Medications. It is not advisable to combine anxiety,sleep and pain medications without talking with your primary care practitioner ? ?Special Instructions: If you have smoked or chewed Tobacco  in the last 2 yrs please stop smoking, stop any regular Alcohol  and or any Recreational drug use. ? ?Wear Seat belts while driving. ? ?Please note: ?You were cared for by a hospitalist during your hospital stay. Once you are discharged, your primary care physician will handle any further medical issues. Please note that NO REFILLS for any discharge medications will be authorized once you are discharged, as it is imperative that you return to your primary care physician (or establish a relationship with a primary care  physician if you do not have one) for your post hospital discharge needs so that they can reassess your need for medications and monitor your lab values.  ? Increase activity slowly   Complete by: As directed ?  ? ?  ? ?Allergies as of 12/07/2021   ? ?   Reactions  ? Gabapentin Other (See Comments)  ? "Unsteady on her feet"  ? Hydrocodone-acetaminophen Other (See Comments)  ? Unknown  ? Nitrofurantoin Other (See Comments)  ? Reaction not known  ? Sulfamethoxazole-trimethoprim Hives, Itching, Other (See Comments)  ? Bactrim or Septra  ? Sulfamethoxazole Hives, Itching, Other (See Comments)  ? (Plain) Sulfamethoxazole  ? ?   ? ?  ?Medication List  ?  ? ?STOP taking these medications   ? ?bisoprolol-hydrochlorothiazide 5-6.25 MG tablet ?Commonly known as: ZIAC ?  ?traMADol 50 MG tablet ?Commonly known as: ULTRAM ?  ?valsartan

## 2021-12-07 NOTE — TOC Transition Note (Signed)
Transition of Care (TOC) - CM/SW Discharge Note ? ? ?Patient Details  ?Name: Brianna Carr ?MRN: [id number] ?Date of Birth: 09/26/1930 ? ?Transition of Care (TOC) CM/SW Contact:  ?Pollie Friar, RN ?Phone Number: ?12/07/2021, 10:04 AM ? ? ?Clinical Narrative:    ?Patient is discharging home with home health services through [facility] [place]. CM spoke to patients daughter this am and she is requesting a hospital bed for home. CM has contacted [facility] for the hospital bed and it will be delivered to the home.  ?Daughter requesting [facility]AR home. CM has verified the address with the daughter. Daughter will need to be contacted with timing of the ambulance.  ?Daughter requesting bedside RN call and go over d/c information with her. CM will update the bedside RN.  ? ? ?Final next level of care: [place] ?Barriers to Discharge: No Barriers Identified ? ? ?Patient Goals and CMS Choice ?Patient states their goals for this hospitalization and ongoing recovery are:: go home ?CMS Medicare.gov Compare Post Acute Care list provided to:: Patient Represenative (must comment) ?Choice offered to / list presented to : Adult Children ? ?Discharge Placement ?  ?           ?  ?  ?  ?  ? ?Discharge Plan and Services ?  ?Discharge Planning Services: CM Consult ?Post Acute Care Choice: Home Health, Durable Medical Equipment          ?DME Arranged: Hospital bed ?DME Agency: [facility] ?Date DME Agency Contacted: 12/07/21 ?Time DME Agency Contacted: 1443 ?Representative spoke with at DME Agency: [place] ?HH Arranged: PT, OT, Nurse's Aide ?[place] Agency: [place] ?Date HH Agency Contacted: 12/05/21 ?Time [place]: 1412 ?Representative spoke with at [place]: [name] ? ?Social Determinants of Health (SDOH) Interventions ?  ? ? ?Readmission Risk Interventions ?No flowsheet data found. ? ? ? ? ?

## 2022-07-19 ENCOUNTER — Encounter (HOSPITAL_COMMUNITY): Payer: Self-pay

## 2022-07-19 ENCOUNTER — Emergency Department (HOSPITAL_COMMUNITY): Payer: Medicare Other

## 2022-07-19 ENCOUNTER — Other Ambulatory Visit: Payer: Self-pay

## 2022-07-19 ENCOUNTER — Emergency Department (HOSPITAL_COMMUNITY)
Admission: EM | Admit: 2022-07-19 | Discharge: 2022-07-19 | Disposition: A | Discharge status: Home / Self Care | Payer: Medicare Other | Attending: Emergency Medicine | Admitting: Emergency Medicine

## 2022-07-19 DIAGNOSIS — Z79899 Other long term (current) drug therapy: Secondary | ICD-10-CM | POA: Diagnosis not present

## 2022-07-19 DIAGNOSIS — N189 Chronic kidney disease, unspecified: Secondary | ICD-10-CM | POA: Diagnosis not present

## 2022-07-19 DIAGNOSIS — I129 Hypertensive chronic kidney disease with stage 1 through stage 4 chronic kidney disease, or unspecified chronic kidney disease: Secondary | ICD-10-CM | POA: Diagnosis not present

## 2022-07-19 DIAGNOSIS — E039 Hypothyroidism, unspecified: Secondary | ICD-10-CM | POA: Insufficient documentation

## 2022-07-19 DIAGNOSIS — R531 Weakness: Secondary | ICD-10-CM | POA: Insufficient documentation

## 2022-07-19 DIAGNOSIS — I471 Supraventricular tachycardia, unspecified: Secondary | ICD-10-CM

## 2022-07-19 DIAGNOSIS — Z7984 Long term (current) use of oral hypoglycemic drugs: Secondary | ICD-10-CM | POA: Diagnosis not present

## 2022-07-19 DIAGNOSIS — E1122 Type 2 diabetes mellitus with diabetic chronic kidney disease: Secondary | ICD-10-CM | POA: Diagnosis not present

## 2022-07-19 LAB — URINALYSIS, ROUTINE W REFLEX MICROSCOPIC
Bilirubin Urine: NEGATIVE
Glucose, UA: NEGATIVE mg/dL
Ketones, ur: NEGATIVE mg/dL
Leukocytes,Ua: NEGATIVE
Nitrite: NEGATIVE
Protein, ur: 100 mg/dL — AB
Specific Gravity, Urine: 1.014 (ref 1.005–1.030)
pH: 6 (ref 5.0–8.0)

## 2022-07-19 LAB — CBC
HCT: 41 % (ref 36.0–46.0)
Hemoglobin: 13.4 g/dL (ref 12.0–15.0)
MCH: 31.8 pg (ref 26.0–34.0)
MCHC: 32.7 g/dL (ref 30.0–36.0)
MCV: 97.4 fL (ref 80.0–100.0)
Platelets: 127 10*3/uL — ABNORMAL LOW (ref 150–400)
RBC: 4.21 MIL/uL (ref 3.87–5.11)
RDW: 15.2 % (ref 11.5–15.5)
WBC: 8.9 10*3/uL (ref 4.0–10.5)
nRBC: 0 % (ref 0.0–0.2)

## 2022-07-19 LAB — COMPREHENSIVE METABOLIC PANEL
ALT: 22 U/L (ref 0–44)
AST: 40 U/L (ref 15–41)
Albumin: 3.6 g/dL (ref 3.5–5.0)
Alkaline Phosphatase: 57 U/L (ref 38–126)
Anion gap: 15 (ref 5–15)
BUN: 27 mg/dL — ABNORMAL HIGH (ref 8–23)
CO2: 21 mmol/L — ABNORMAL LOW (ref 22–32)
Calcium: 9.1 mg/dL (ref 8.9–10.3)
Chloride: 103 mmol/L (ref 98–111)
Creatinine, Ser: 1.33 mg/dL — ABNORMAL HIGH (ref 0.44–1.00)
GFR, Estimated: 38 mL/min — ABNORMAL LOW (ref >60)
Glucose, Bld: 144 mg/dL — ABNORMAL HIGH (ref 70–99)
Potassium: 3.9 mmol/L (ref 3.5–5.1)
Sodium: 139 mmol/L (ref 135–145)
Total Bilirubin: 1 mg/dL (ref 0.3–1.2)
Total Protein: 6.5 g/dL (ref 6.5–8.1)

## 2022-07-19 LAB — MAGNESIUM: Magnesium: 1.5 mg/dL — ABNORMAL LOW (ref 1.7–2.4)

## 2022-07-19 LAB — CBG MONITORING, ED: Glucose-Capillary: 109 mg/dL — ABNORMAL HIGH (ref 70–99)

## 2022-07-19 MED ORDER — MAGNESIUM SULFATE IN D5W 1-5 GM/100ML-% IV SOLN
1.0000 g | Freq: Once | INTRAVENOUS | Status: AC
  Administered 2022-07-19: 1 g via INTRAVENOUS
  Filled 2022-07-19: qty 100

## 2022-07-19 MED ORDER — LACTATED RINGERS IV BOLUS
500.0000 mL | Freq: Once | INTRAVENOUS | Status: AC
  Administered 2022-07-19: 500 mL via INTRAVENOUS

## 2022-07-19 MED ORDER — BACITRACIN ZINC 500 UNIT/GM EX OINT
TOPICAL_OINTMENT | Freq: Two times a day (BID) | CUTANEOUS | Status: DC
  Administered 2022-07-19: 1 via TOPICAL
  Filled 2022-07-19: qty 0.9

## 2022-07-19 MED ORDER — SODIUM CHLORIDE 0.9 % IV BOLUS
250.0000 mL | Freq: Once | INTRAVENOUS | Status: AC
  Administered 2022-07-19: 250 mL via INTRAVENOUS

## 2022-07-19 NOTE — ED Notes (Signed)
Patient unsuccessfully used restroom at this time via bed pan

## 2022-07-19 NOTE — Discharge Instructions (Addendum)
Follow-up with cardiology given your episodes of rapid heart rate.  Follow-up with your primary care doctor

## 2022-07-19 NOTE — ED Notes (Signed)
Got patient on the monitor did ekg shown to Dr Jeanell Sparrow patient is resting with nurse at bedside and call bell in reach

## 2022-07-19 NOTE — ED Notes (Signed)
Patient transported to X-ray 

## 2022-07-19 NOTE — ED Triage Notes (Signed)
Pt BIB [facility] from home d/t fall and svt. Pt lives by herself, and family checks on her everyday.  Pt rolled off the couch, possible LOC. Not on thinners.  Pressure was in the 80s initially. SVT w EMS. No hx of SVT. A&O X3. Pt has dementia.   114/60 HR 144 CBG 204

## 2022-07-19 NOTE — ED Provider Notes (Signed)
[facility] EMERGENCY DEPARTMENT Provider Note   CSN: [id number] Arrival date & time: 07/19/22  1256     History Chief Complaint  Patient presents with   Fall   svt    LAMYRA MALCOLM is a 88 y.o. female.   Fall Patient is an 88 year old female with past medical history significant for memory issues/dementia, HTN, CKD, DM 2, hypothyroidism, AKI  She presented emergency room today after an episode of weakness earlier today per patient and daughter and granddaughter who I talked to on the phone: Patient was laying on the couch and after laying there for over an hour she then said on camera "I can't get up"  then rolled off the couch after the phone rang.  Stays pretty confused at baseline per daughter. Usually knows city but not date. Can make cereal and tea. Stays in house on her own.  Per daughter she is not very good about drinking water.  Did have a recent UTI which she is treated for. Denies any urinary symptoms currently.      Home Medications Prior to Admission medications   Medication Sig Start Date End Date Taking? Authorizing Provider  bisoprolol (ZEBETA) 10 MG tablet Take 1 tablet (10 mg total) by mouth daily. 12/07/21   Ghimire, Henreitta Leber, MD  hydrocortisone 2.5 % ointment Apply 1 application topically 2 (two) times daily. 02/21/18   [provider]  levothyroxine (SYNTHROID) 100 MCG tablet Take 100 mcg by mouth daily. 11/09/21   [provider]  metFORMIN (GLUCOPHAGE) 500 MG tablet Take 250-500 mg by mouth daily.  12/19/17   [provider]  simvastatin (ZOCOR) 10 MG tablet Take 10 mg by mouth at bedtime.    [provider]      Allergies    Gabapentin, Hydrocodone-acetaminophen, Nitrofurantoin, Sulfamethoxazole-trimethoprim, and Sulfamethoxazole    Review of Systems   Review of Systems  Physical Exam Updated Vital Signs BP (!) 153/77   Pulse 95   Temp 98.3 F (36.8 C) (Oral)   Resp 16   SpO2 100%   Physical Exam Vitals and nursing note reviewed.  Constitutional:      General: She is not in acute distress. HENT:     Head: Normocephalic and atraumatic.     Nose: Nose normal.  Eyes:     General: No scleral icterus. Cardiovascular:     Rate and Rhythm: Normal rate and regular rhythm.     Pulses: Normal pulses.     Heart sounds: Normal heart sounds.  Pulmonary:     Effort: Pulmonary effort is normal. No respiratory distress.     Breath sounds: No wheezing.  Abdominal:     Palpations: Abdomen is soft.     Tenderness: There is no abdominal tenderness.  Musculoskeletal:     Cervical back: Normal range of motion.     Right lower leg: No edema.     Left lower leg: No edema.     Comments: Some mild tenderness to palpation of left shoulder and discomfort with range of motion of left shoulder.  Also some tenderness of the left humerus midshaft  No other upper extremity tenderness.  No lower extremity tenderness no hip tenderness of left or right hip.  No C, T, L-spine tenderness. Head is without evidence of trauma.  Skin:    General: Skin is warm and dry.     Capillary Refill: Capillary refill takes less than 2 seconds.     Comments: Small superficial skin tears to  left forearm  Neurological:     Mental Status: She is alert. Mental status is at baseline.  Psychiatric:        Mood and Affect: Mood normal.        Behavior: Behavior normal.    ED Results / Procedures / Treatments   Labs (all labs ordered are listed, but only abnormal results are displayed) Labs Reviewed  CBC - Abnormal; Notable for the following components:      Result Value   Platelets 127 (*)    All other components within normal limits  COMPREHENSIVE METABOLIC PANEL - Abnormal; Notable for the following components:   CO2 21 (*)    Glucose, Bld 144 (*)    BUN 27 (*)    Creatinine, Ser 1.33 (*)    GFR, Estimated 38 (*)    All other components within normal limits  MAGNESIUM - Abnormal; Notable for the  following components:   Magnesium 1.5 (*)    All other components within normal limits  CBG MONITORING, ED - Abnormal; Notable for the following components:   Glucose-Capillary 109 (*)    All other components within normal limits  URINALYSIS, ROUTINE W REFLEX MICROSCOPIC    EKG EKG Interpretation  Date/Time:  Thursday July 19 2022 14:00:22 EDT Ventricular Rate:  94 PR Interval:  230 QRS Duration: 89 QT Interval:  371 QTC Calculation: 464 R Axis:   30 Text Interpretation: Sinus rhythm Prolonged PR interval Anteroseptal infarct, old rate has tracing earlier today, rate has significantly decreased Confirmed by Pattricia Boss [id number]) on 07/19/2022 2:03:45 PM  Radiology No results found.  Procedures Procedures    Medications Ordered in ED Medications  magnesium sulfate IVPB 1 g 100 mL (has no administration in time range)  lactated ringers bolus 500 mL (0 mLs Intravenous Stopped 07/19/22 1458)    ED Course/ Medical Decision Making/ A&P Clinical Course as of 07/19/22 1623  Thu Jul 19, 2022  1619 No fractures or dislocation of the humerus or shoulder joint  I personally viewed these x-rays. [WF]    Clinical Course User Index [WF] Tedd Sias, PA                           Medical Decision Making Amount and/or Complexity of Data Reviewed Labs: ordered. Radiology: ordered.  Risk OTC drugs. Prescription drug management.   This patient presents to the ED for concern of weakness, this involves a number of treatment options, and is a complaint that carries with it a high risk of complications and morbidity. A differential diagnosis was considered for the patient's symptoms which is discussed below:   The differential diagnosis of weakness includes but is not limited to neurologic causes (GBS, myasthenia gravis, CVA, MS, ALS, transverse myelitis, spinal cord injury, CVA, botulism, ) and other causes: ACS, Arrhythmia, syncope, orthostatic hypotension, sepsis,  hypoglycemia, electrolyte disturbance, hypothyroidism, respiratory failure, symptomatic anemia, dehydration, heat injury, polypharmacy, malignancy.   Co morbidities: Discussed in HPI   Brief History:  Patient is an 88 year old female with past medical history significant for memory issues/dementia, HTN, CKD, DM 2, hypothyroidism, AKI  She presented emergency room today after an episode of weakness earlier today per patient and daughter and granddaughter who I talked to on the phone: Patient was laying on the couch and after laying there for over an hour she then said on camera "I can't get up"  then rolled off the couch after the phone rang.  Stays  pretty confused at baseline per daughter. Usually knows city but not date. Can make cereal and tea. Stays in house on her own.  Per daughter she is not very good about drinking water.  Did have a recent UTI which she is treated for. Denies any urinary symptoms currently.     EMR reviewed including pt PMHx, past surgical history and past visits to ER.   See HPI for more details   Lab Tests:   I ordered and independently interpreted labs. Labs notable for mild hypomagnesemia.  CMP unremarkable.  At baseline CBC without anemia or significant other finding.  UA pending   Imaging Studies:  NAD. I personally reviewed all imaging studies and no acute abnormality found. I agree with radiology interpretation.  LUE without fracture.   Cardiac Monitoring:  The patient was maintained on a cardiac monitor.  I personally viewed and interpreted the cardiac monitored which showed an underlying rhythm of: SVT converting to NSR during discussion.  EKG non-ischemic second EKG shows resulution of tachy rhythm.    Medicines ordered:  I ordered medication including gentle IV fluid and magnesium.  Reevaluation of the patient after these medicines showed that the patient improved I have reviewed the patients home medicines and have made adjustments as  needed   Critical Interventions:     Consults/Attending Physician   I discussed this case with my attending physician who cosigned this note including patient's presenting symptoms, physical exam, and planned diagnostics and interventions. Attending physician stated agreement with plan or made changes to plan which were implemented.   Reevaluation:  After the interventions noted above I re-evaluated patient and found that they have :stayed the same   Social Determinants of Health:      Problem List / ED Course:  SVT episode - resolved. Waiting for urine results. Anticipate DC.    Dispostion:  7:36 PM Care of Nevae Pinnix Kitner transferred to Dr. Zenia Resides at the end of my shift as the patient will require reassessment once labs/imaging have resulted. Patient presentation, ED course, and plan of care discussed with review of all pertinent labs and imaging. Please see his/her note for further details regarding further ED course and disposition. Plan at time of handoff is discharge. This may be altered or completely changed at the discretion of the oncoming team pending results of further workup.   Final Clinical Impression(s) / ED Diagnoses Final diagnoses:  None    Rx / DC Orders ED Discharge Orders     None         Tedd Sias, [place] 07/19/22 Artist Pais    Pattricia Boss, MD 07/20/22 1147

## 2022-07-19 NOTE — ED Provider Notes (Signed)
Urinalysis negative.  Patient stable for discharge.  Family in agreement   Lacretia Leigh, MD 07/19/22 2049

## 2022-07-24 ENCOUNTER — Ambulatory Visit: Payer: Medicare Other | Attending: Cardiovascular Disease | Admitting: Cardiovascular Disease

## 2022-07-24 VITALS — BP 115/84 | HR 86 | Ht 62.0 in | Wt 134.2 lb

## 2022-07-24 DIAGNOSIS — R000 Tachycardia, unspecified: Secondary | ICD-10-CM

## 2022-07-24 MED ORDER — BISOPROLOL FUMARATE 5 MG PO TABS: 5.0000 mg | ORAL_TABLET | Freq: Every day | ORAL | 3 refills | Status: AC

## 2022-07-24 MED ORDER — PROPRANOLOL HCL 10 MG PO TABS: 10.0000 mg | ORAL_TABLET | Freq: Four times a day (QID) | ORAL | 3 refills | Status: AC

## 2022-07-24 MED ORDER — AMLODIPINE BESYLATE 5 MG PO TABS: 5.0000 mg | ORAL_TABLET | Freq: Every day | ORAL | 3 refills | Status: AC

## 2022-07-24 NOTE — Patient Instructions (Signed)
Medication Instructions:  STOP Hydralazine STOP Bisoprolol/HCTZ START Bisoprolol '5mg'$  daily START Amlodipine '5mg'$  daily START Propranolol '10mg'$  up to four times daily as needed *If you need a refill on your cardiac medications before your next appointment, please call your pharmacy*   Lab Work: NONE If you have labs (blood work) drawn today and your tests are completely normal, you will receive your results only by: [place] (if you have MyChart) OR A paper copy in the mail If you have any lab test that is abnormal or we need to change your treatment, we will call you to review the results.   Testing/Procedures: NONE   Follow-Up: At [facility], you and your health needs are our priority.  As part of our continuing mission to provide you with exceptional heart care, we have created designated Provider Care Teams.  These Care Teams include your primary Cardiologist (physician) and Advanced Practice Providers (APPs -  Physician Assistants and Nurse Practitioners) who all work together to provide you with the care you need, when you need it.  Your next appointment:   4 week(s)  The format for your next appointment:   In Person  Provider:   Mertie Moores, MD    Important Information About Sugar

## 2022-07-24 NOTE — Progress Notes (Signed)
Cardiology Office Note:    Date:  07/24/2022   ID:  Brianna Carr, DOB 1931-07-24, MRN [id number]  PCP:  Deon Pilling, NP   [place] Providers Cardiologist:  Bexton Haak   Referring MD: Deon Pilling, NP   Chief Complaint  Patient presents with   Tachycardia    History of Present Illness:    Brianna Carr is a 88 y.o. female with a hx of SVT  Seen with Brianna Carr, daughter,   Brianna Carr presented to the emergency room on October 26 with episodes of tachycardia.  EKG revealed supraventricular tachycardia.  I cannot tell if she received adenosine  She has dementia and cannot tell me    Past Medical History:  Diagnosis Date   AKI (acute kidney injury) ([place])    Dementia ([place])    Diabetes mellitus without complication ([place])    borderline diabetes   HTN (hypertension)    Hypothyroidism (acquired)     Past Surgical History:  Procedure Laterality Date   ROTATOR CUFF REPAIR      Current Medications: Current Meds  Medication Sig   amLODipine (NORVASC) 5 MG tablet Take 1 tablet (5 mg total) by mouth daily.   bisoprolol (ZEBETA) 5 MG tablet Take 1 tablet (5 mg total) by mouth daily.   levothyroxine (SYNTHROID) 100 MCG tablet Take 100 mcg by mouth daily.   propranolol (INDERAL) 10 MG tablet Take 1 tablet (10 mg total) by mouth 4 (four) times daily.   [DISCONTINUED] bisoprolol-hydrochlorothiazide (ZIAC) 5-6.25 MG tablet Take 1 tablet by mouth daily.   [DISCONTINUED] hydrALAZINE (APRESOLINE) 10 MG tablet Take 10 mg by mouth 3 (three) times daily.     Allergies:   Gabapentin, Hydrocodone-acetaminophen, Nitrofurantoin, Sulfamethoxazole-trimethoprim, and Sulfamethoxazole   Social History   Socioeconomic History   Marital status: Married    Spouse name: Not on file   Number of children: Not on file   Years of education: Not on file   Highest education level: Not on file  Occupational History   Occupation: retired  Tobacco Use   Smoking status: Never   Smokeless  tobacco: Never  Vaping Use   Vaping Use: Never used  Substance and Sexual Activity   Alcohol use: Never   Drug use: Never   Sexual activity: Not on file  Other Topics Concern   Not on file  Social History Narrative   Not on file   Social Determinants of Health   Financial Resource Strain: Not on file  Food Insecurity: Not on file  Transportation Needs: Not on file  Physical Activity: Not on file  Stress: Not on file  Social Connections: Not on file     Family History: The patient's family history is not on file.  ROS:   Please see the history of present illness.     All other systems reviewed and are negative.  EKGs/Labs/Other Studies Reviewed:    The following studies were reviewed today:   EKG: July 24, 2022: Normal sinus rhythm at 88.  Occasional premature ventricular contractions and fusion complexes.  Recent Labs: 07/19/2022: ALT 22; BUN 27; Creatinine, Ser 1.33; Hemoglobin 13.4; Magnesium 1.5; Platelets 127; Potassium 3.9; Sodium 139  Recent Lipid Panel No results found for: "CHOL", "TRIG", "HDL", "CHOLHDL", "VLDL", "LDLCALC", "LDLDIRECT"   Risk Assessment/Calculations:                Physical Exam:    VS:  BP 115/84   Pulse 86   Ht '5\' 2"'$  (1.575 m)   Wt  134 lb 3.2 oz (60.9 kg)   SpO2 99%   BMI 24.55 kg/m     Wt Readings from Last 3 Encounters:  07/24/22 134 lb 3.2 oz (60.9 kg)  12/03/21 140 lb (63.5 kg)  03/03/18 120 lb (54.4 kg)     GEN:  Well nourished, well developed in no acute distress HEENT: Normal NECK: No JVD; No carotid bruits LYMPHATICS: No lymphadenopathy CARDIAC: RRR, no murmurs, rubs, gallops RESPIRATORY:  Clear to auscultation without rales, wheezing or rhonchi  ABDOMEN: Soft, non-tender, non-distended MUSCULOSKELETAL:  No edema; No deformity  SKIN: Warm and dry NEUROLOGIC:  Alert and oriented x 3 PSYCHIATRIC:  Normal affect   ASSESSMENT:    1. Tachycardia    PLAN:      Supraventricular tachycardia: Brianna Carr  presented to the emergency room after an episode of near syncope.  She was found to have SVT.  The record does not indicate whether or not she received adenosine and the patient cannot tell me.  She was discharged in sinus rhythm.  She is in sinus rhythm today but did have some transient episodes of tachycardia while was taking her blood pressure. She did not take her bisoprolol HCT tablet today.  We will start her on bisoprolol alone without the HCTZ. We will give her propranolol tablets to take 10 mg on an as-needed basis for tachycardia.  The daughter understands when to give her the extra propranolol tablets.   2.  Hypertension: She is on hydralazine which she is taking 3 times a day but admits that she does not take it all that regularly.  She has significant dementia and cannot remember to take her medicines.  I think she will do better with amlodipine 5 mg a day.  We will have her see me or an APP in 1 month.           Medication Adjustments/Labs and Tests Ordered: Current medicines are reviewed at length with the patient today.  Concerns regarding medicines are outlined above.  Orders Placed This Encounter  Procedures   EKG 12-Lead   Meds ordered this encounter  Medications   bisoprolol (ZEBETA) 5 MG tablet    Sig: Take 1 tablet (5 mg total) by mouth daily.    Dispense:  90 tablet    Refill:  3   amLODipine (NORVASC) 5 MG tablet    Sig: Take 1 tablet (5 mg total) by mouth daily.    Dispense:  90 tablet    Refill:  3   propranolol (INDERAL) 10 MG tablet    Sig: Take 1 tablet (10 mg total) by mouth 4 (four) times daily.    Dispense:  120 tablet    Refill:  3     Patient Instructions  Medication Instructions:  STOP Hydralazine STOP Bisoprolol/HCTZ START Bisoprolol '5mg'$  daily START Amlodipine '5mg'$  daily START Propranolol '10mg'$  up to four times daily as needed *If you need a refill on your cardiac medications before your next appointment, please call your  pharmacy*   Lab Work: NONE If you have labs (blood work) drawn today and your tests are completely normal, you will receive your results only by: [place] (if you have MyChart) OR A paper copy in the mail If you have any lab test that is abnormal or we need to change your treatment, we will call you to review the results.   Testing/Procedures: NONE   Follow-Up: At [facility], you and your health needs are our priority.  As part  of our continuing mission to provide you with exceptional heart care, we have created designated Provider Care Teams.  These Care Teams include your primary Cardiologist (physician) and Advanced Practice Providers (APPs -  Physician Assistants and Nurse Practitioners) who all work together to provide you with the care you need, when you need it.  Your next appointment:   4 week(s)  The format for your next appointment:   In Person  Provider:   Mertie Moores, MD    Important Information About Sugar         Signed, Mertie Moores, MD  07/24/2022 5:39 PM    [place]

## 2022-08-27 ENCOUNTER — Encounter: Payer: Self-pay | Admitting: Cardiovascular Disease

## 2022-08-27 NOTE — Progress Notes (Unsigned)
Cardiology Office Note:    Date:  08/28/2022   ID:  Brianna Carr, DOB 1931/07/04, MRN [id number]  PCP:  Deon Pilling, NP   [place] Prov[facility] Cardiologist:  Kieara Schwark   Referring MD: Deon Pilling, NP   Chief Complaint  Patient presents with   Tachycardia    History of Present Illness:    Brianna Carr is a 88 y.o. female with a hx of SVT  Seen with Brianna Carr, daughter,   Brianna Carr presented to the emergency room on October 26 with episodes of tachycardia.  EKG revealed supraventricular tachycardia.  I cannot tell if she received adenosine  She has dementia and cannot tell me   Dec.4   2023  Brianna Carr is seen with daughter, Brianna Carr. Hx of SVT  Patient is pleasantly demented  She is tolerating metoprolol.  She has not had any further episodes of tachycardia.  Past Medical History:  Diagnosis Date   AKI (acute kidney injury) ([place])    Dementia ([place])    Diabetes mellitus without complication ([place])    borderline diabetes   HTN (hypertension)    Hypothyroidism (acquired)     Past Surgical History:  Procedure Laterality Date   ROTATOR CUFF REPAIR      Current Medications: Current Meds  Medication Sig   amLODipine (NORVASC) 5 MG tablet Take 1 tablet (5 mg total) by mouth daily.   bisoprolol (ZEBETA) 5 MG tablet Take 1 tablet (5 mg total) by mouth daily.   levothyroxine (SYNTHROID) 100 MCG tablet Take 100 mcg by mouth daily.   metFORMIN (GLUCOPHAGE) 500 MG tablet Take 500 mg by mouth daily. Take 1 tablet by mouth daily with a meal   propranolol (INDERAL) 10 MG tablet Take 1 tablet (10 mg total) by mouth 4 (four) times daily.     Allergies:   Gabapentin, Hydrocodone-acetaminophen, Nitrofurantoin, Sulfamethoxazole-trimethoprim, and Sulfamethoxazole   Social History   Socioeconomic History   Marital status: Married    Spouse name: Not on file   Number of children: Not on file   Years of education: Not on file   Highest education level: Not on file   Occupational History   Occupation: retired  Tobacco Use   Smoking status: Never   Smokeless tobacco: Never  Vaping Use   Vaping Use: Never used  Substance and Sexual Activity   Alcohol use: Never   Drug use: Never   Sexual activity: Not on file  Other Topics Concern   Not on file  Social History Narrative   Not on file   Social Determinants of Health   Financial Resource Strain: Not on file  Food Insecurity: Not on file  Transportation Needs: Not on file  Physical Activity: Not on file  Stress: Not on file  Social Connections: Not on file     Family History: The patient's family history is not on file.  ROS:   Please see the history of present illness.     All other systems reviewed and are negative.  EKGs/Labs/Other Studies Reviewed:    The following studies were reviewed today:   EKG:   Recent Labs: 07/19/2022: ALT 22; BUN 27; Creatinine, Ser 1.33; Hemoglobin 13.4; Magnesium 1.5; Platelets 127; Potassium 3.9; Sodium 139  Recent Lipid Panel No results found for: "CHOL", "TRIG", "HDL", "CHOLHDL", "VLDL", "LDLCALC", "LDLDIRECT"   Risk Assessment/Calculations:                Physical Exam:    Physical Exam: Blood pressure 124/72, pulse (!) 52,  height '5\' 2"'$  (1.575 m), weight 129 lb (58.5 kg), SpO2 98 %.       GEN:  elderly , frail female  in no acute distress, pleasantly demented  HEENT: Normal NECK: No JVD; No carotid bruits LYMPHATICS: No lymphadenopathy CARDIAC: RRR , no murmurs, rubs, gallops RESPIRATORY:  Clear to auscultation without rales, wheezing or rhonchi  ABDOMEN: Soft, non-tender, non-distended MUSCULOSKELETAL:  No edema; No deformity  SKIN: Warm and dry NEUROLOGIC:  Alert and oriented x 3   ASSESSMENT:    1. SVT (supraventricular tachycardia)     PLAN:      Supraventricular tachycardia: She is doing very well.  She has not had any episodes of SVT.  Continue metoprolol.  2.  Hypertension:  Blood pressure is  well-controlled.  Will have her return to see an APP in 1 year.         Medication Adjustments/Labs and Tests Ordered: Current medicines are reviewed at length with the patient today.  Concerns regarding medicines are outlined above.  No orders of the defined types were placed in this encounter.  No orders of the defined types were placed in this encounter.    Patient Instructions  Medication Instructions:  Your physician recommends that you continue on your current medications as directed. Please refer to the Current Medication list given to you today.  *If you need a refill on your cardiac medications before your next appointment, please call your pharmacy*   Lab Work: NONE If you have labs (blood work) drawn today and your tests are completely normal, you will receive your results only by: [place] (if you have MyChart) OR A paper copy in the mail If you have any lab test that is abnormal or we need to change your treatment, we will call you to review the results.   Testing/Procedures: NONE   Follow-Up: At [facility], you and your health needs are our priority.  As part of our continuing mission to provide you with exceptional heart care, we have created designated Provider Care Teams.  These Care Teams include your primary Cardiologist (physician) and Advanced Practice Providers (APPs -  Physician Assistants and Nurse Practitioners) who all work together to provide you with the care you need, when you need it.  We recommend signing up for the patient portal called "MyChart".  Sign up information is provided on this After Visit Summary.  MyChart is used to connect with patients for Virtual Visits (Telemedicine).  Patients are able to view lab/test results, encounter notes, upcoming appointments, etc.  Non-urgent messages can be sent to your provider as well.   To learn more about what you can do with MyChart, go to [email or web address].    Your next  appointment:   1 year(s)  The format for your next appointment:   In Person  Provider:   Mertie Moores, MD       Important Information About Sugar         Signed, Mertie Moores, MD  08/28/2022 5:57 PM    [place]

## 2022-08-28 ENCOUNTER — Ambulatory Visit: Payer: Medicare Other | Attending: Cardiovascular Disease | Admitting: Cardiovascular Disease

## 2022-08-28 ENCOUNTER — Encounter: Payer: Self-pay | Admitting: Cardiovascular Disease

## 2022-08-28 VITALS — BP 124/72 | HR 52 | Ht 62.0 in | Wt 129.0 lb

## 2022-08-28 DIAGNOSIS — I471 Supraventricular tachycardia, unspecified: Secondary | ICD-10-CM

## 2022-08-28 NOTE — Patient Instructions (Signed)
Medication Instructions:  Your physician recommends that you continue on your current medications as directed. Please refer to the Current Medication list given to you today.  *If you need a refill on your cardiac medications before your next appointment, please call your pharmacy*   Lab Work: NONE If you have labs (blood work) drawn today and your tests are completely normal, you will receive your results only by: MyChart Message (if you have MyChart) OR A paper copy in the mail If you have any lab test that is abnormal or we need to change your treatment, we will call you to review the results.   Testing/Procedures: NONE   Follow-Up: At  [facility], you and your health needs are our priority.  As part of our continuing mission to provide you with exceptional heart care, we have created designated Provider Care Teams.  These Care Teams include your primary Cardiologist (physician) and Advanced Practice Providers (APPs -  Physician Assistants and Nurse Practitioners) who all work together to provide you with the care you need, when you need it.  We recommend signing up for the patient portal called "MyChart".  Sign up information is provided on this After Visit Summary.  MyChart is used to connect with patients for Virtual Visits (Telemedicine).  Patients are able to view lab/test results, encounter notes, upcoming appointments, etc.  Non-urgent messages can be sent to your provider as well.   To learn more about what you can do with MyChart, go to [email or web address].    Your next appointment:   1 year(s)  The format for your next appointment:   In Person  Provider:   Philip Nahser, MD       Important Information About Sugar       

## 2022-10-18 ENCOUNTER — Emergency Department (HOSPITAL_COMMUNITY): Payer: Medicare Other

## 2022-10-18 ENCOUNTER — Encounter (HOSPITAL_COMMUNITY): Payer: Self-pay

## 2022-10-18 ENCOUNTER — Emergency Department (HOSPITAL_COMMUNITY)
Admission: EM | Admit: 2022-10-18 | Discharge: 2022-10-18 | Disposition: A | Discharge status: Home / Self Care | Payer: Medicare Other | Attending: Emergency Medicine | Admitting: Emergency Medicine

## 2022-10-18 DIAGNOSIS — W182XXA Fall in (into) shower or empty bathtub, initial encounter: Secondary | ICD-10-CM | POA: Insufficient documentation

## 2022-10-18 DIAGNOSIS — E039 Hypothyroidism, unspecified: Secondary | ICD-10-CM | POA: Diagnosis not present

## 2022-10-18 DIAGNOSIS — E119 Type 2 diabetes mellitus without complications: Secondary | ICD-10-CM | POA: Diagnosis not present

## 2022-10-18 DIAGNOSIS — Z79890 Hormone replacement therapy (postmenopausal): Secondary | ICD-10-CM | POA: Insufficient documentation

## 2022-10-18 DIAGNOSIS — Z7984 Long term (current) use of oral hypoglycemic drugs: Secondary | ICD-10-CM | POA: Insufficient documentation

## 2022-10-18 DIAGNOSIS — S63502A Unspecified sprain of left wrist, initial encounter: Secondary | ICD-10-CM | POA: Insufficient documentation

## 2022-10-18 DIAGNOSIS — W19XXXA Unspecified fall, initial encounter: Secondary | ICD-10-CM

## 2022-10-18 DIAGNOSIS — F0390 Unspecified dementia without behavioral disturbance: Secondary | ICD-10-CM | POA: Insufficient documentation

## 2022-10-18 DIAGNOSIS — S61511A Laceration without foreign body of right wrist, initial encounter: Secondary | ICD-10-CM | POA: Insufficient documentation

## 2022-10-18 DIAGNOSIS — Z79899 Other long term (current) drug therapy: Secondary | ICD-10-CM | POA: Diagnosis not present

## 2022-10-18 DIAGNOSIS — I10 Essential (primary) hypertension: Secondary | ICD-10-CM | POA: Diagnosis not present

## 2022-10-18 DIAGNOSIS — M25552 Pain in left hip: Secondary | ICD-10-CM | POA: Diagnosis present

## 2022-10-18 LAB — CBC WITH DIFFERENTIAL/PLATELET
Abs Immature Granulocytes: 0.04 10*3/uL (ref 0.00–0.07)
Basophils Absolute: 0.1 10*3/uL (ref 0.0–0.1)
Basophils Relative: 1 %
Eosinophils Absolute: 0 10*3/uL (ref 0.0–0.5)
Eosinophils Relative: 0 %
HCT: 35.9 % — ABNORMAL LOW (ref 36.0–46.0)
Hemoglobin: 11.5 g/dL — ABNORMAL LOW (ref 12.0–15.0)
Immature Granulocytes: 0 %
Lymphocytes Relative: 8 %
Lymphs Abs: 0.8 10*3/uL (ref 0.7–4.0)
MCH: 29.3 pg (ref 26.0–34.0)
MCHC: 32 g/dL (ref 30.0–36.0)
MCV: 91.6 fL (ref 80.0–100.0)
Monocytes Absolute: 0.8 10*3/uL (ref 0.1–1.0)
Monocytes Relative: 7 %
Neutro Abs: 9.1 10*3/uL — ABNORMAL HIGH (ref 1.7–7.7)
Neutrophils Relative %: 84 %
Platelets: 94 10*3/uL — ABNORMAL LOW (ref 150–400)
RBC: 3.92 MIL/uL (ref 3.87–5.11)
RDW: 13.2 % (ref 11.5–15.5)
WBC: 10.8 10*3/uL — ABNORMAL HIGH (ref 4.0–10.5)
nRBC: 0 % (ref 0.0–0.2)

## 2022-10-18 LAB — COMPREHENSIVE METABOLIC PANEL
ALT: 20 U/L (ref 0–44)
AST: 37 U/L (ref 15–41)
Albumin: 3.3 g/dL — ABNORMAL LOW (ref 3.5–5.0)
Alkaline Phosphatase: 52 U/L (ref 38–126)
Anion gap: 10 (ref 5–15)
BUN: 27 mg/dL — ABNORMAL HIGH (ref 8–23)
CO2: 21 mmol/L — ABNORMAL LOW (ref 22–32)
Calcium: 8.3 mg/dL — ABNORMAL LOW (ref 8.9–10.3)
Chloride: 108 mmol/L (ref 98–111)
Creatinine, Ser: 0.79 mg/dL (ref 0.44–1.00)
GFR, Estimated: >60 mL/min (ref >60)
Glucose, Bld: 100 mg/dL — ABNORMAL HIGH (ref 70–99)
Potassium: 2.9 mmol/L — ABNORMAL LOW (ref 3.5–5.1)
Sodium: 139 mmol/L (ref 135–145)
Total Bilirubin: 1.1 mg/dL (ref 0.3–1.2)
Total Protein: 6.1 g/dL — ABNORMAL LOW (ref 6.5–8.1)

## 2022-10-18 LAB — URINALYSIS, ROUTINE W REFLEX MICROSCOPIC
Bacteria, UA: NONE SEEN
Bilirubin Urine: NEGATIVE
Glucose, UA: NEGATIVE mg/dL
Ketones, ur: NEGATIVE mg/dL
Nitrite: NEGATIVE
Protein, ur: >=300 mg/dL — AB
Specific Gravity, Urine: 1.014 (ref 1.005–1.030)
pH: 7 (ref 5.0–8.0)

## 2022-10-18 MED ORDER — AMLODIPINE BESYLATE 5 MG PO TABS
5.0000 mg | ORAL_TABLET | Freq: Every day | ORAL | Status: DC
  Administered 2022-10-18: 5 mg via ORAL
  Filled 2022-10-18: qty 1

## 2022-10-18 MED ORDER — POTASSIUM CHLORIDE ER 10 MEQ PO TBCR: 10.0000 meq | EXTENDED_RELEASE_TABLET | Freq: Every day | ORAL | 0 refills | Status: AC

## 2022-10-18 MED ORDER — PROPRANOLOL HCL 20 MG PO TABS
10.0000 mg | ORAL_TABLET | Freq: Once | ORAL | Status: AC
  Administered 2022-10-18: 10 mg via ORAL
  Filled 2022-10-18: qty 1

## 2022-10-18 NOTE — ED Provider Notes (Signed)
[facility] Provider Note   CSN: [id number] Arrival date & time: 10/18/22  1117     History  Chief Complaint  Patient presents with   Brianna Carr is a 89 y.o. female.   Fall  Patient brought in after being found in bathtub.  Reportedly had a fall.  Patient does not remember what happened.  Complaining of mild pain in her left hip area.  Does have history dementia.  Denies fevers.  Denies headache.  EMS states that patient may be more confused than normal.    Past Medical History:  Diagnosis Date   AKI (acute kidney injury) ([place])    Dementia ([place])    Diabetes mellitus without complication ([place])    borderline diabetes   HTN (hypertension)    Hypothyroidism (acquired)     Home Medications Prior to Admission medications   Medication Sig Start Date End Date Taking? Authorizing Provider  potassium chloride (KLOR-CON) 10 MEQ tablet Take 1 tablet (10 mEq total) by mouth daily. 10/18/22  Yes Davonna Belling, MD  amLODipine (NORVASC) 5 MG tablet Take 1 tablet (5 mg total) by mouth daily. 07/24/22   Nahser, Wonda Cheng, MD  bisoprolol (ZEBETA) 5 MG tablet Take 1 tablet (5 mg total) by mouth daily. 07/24/22   Nahser, Wonda Cheng, MD  levothyroxine (SYNTHROID) 100 MCG tablet Take 100 mcg by mouth daily. 11/09/21   [provider]  metFORMIN (GLUCOPHAGE) 500 MG tablet Take 500 mg by mouth daily. Take 1 tablet by mouth daily with a meal 07/21/22   [provider]  propranolol (INDERAL) 10 MG tablet Take 1 tablet (10 mg total) by mouth 4 (four) times daily. 07/24/22   Nahser, Wonda Cheng, MD      Allergies    Gabapentin, Hydrocodone-acetaminophen, Nitrofurantoin, Sulfamethoxazole-trimethoprim, and Sulfamethoxazole    Review of Systems   Review of Systems  Physical Exam Updated Vital Signs BP (!) 187/100   Pulse (!) 56   Temp (!) 95.8 F (35.4 C) (Rectal)   Resp 12   SpO2 98%  Physical Exam Vitals and  nursing note reviewed.  HENT:     Head: Atraumatic.  Eyes:     Pupils: Pupils are equal, round, and reactive to light.  Cardiovascular:     Rate and Rhythm: Regular rhythm.  Pulmonary:     Breath sounds: No wheezing.  Abdominal:     Tenderness: There is no abdominal tenderness.  Musculoskeletal:        General: Tenderness present.     Cervical back: Neck supple.     Comments: Mild tenderness to left hip laterally.  Pelvis is stable.  Good range of motion hip.  Does have abrasion with hematoma on the left dorsum of wrist.  Good range of motion.  Small skin tear on right wrist with underlying bony tenderness.  Skin:    Capillary Refill: Capillary refill takes less than 2 seconds.  Neurological:     Mental Status: She is alert.     ED Results / Procedures / Treatments   Labs (all labs ordered are listed, but only abnormal results are displayed) Labs Reviewed  URINALYSIS, ROUTINE W REFLEX MICROSCOPIC - Abnormal; Notable for the following components:      Result Value   APPearance HAZY (*)    Hgb urine dipstick MODERATE (*)    Protein, ur >=300 (*)    Leukocytes,Ua TRACE (*)    All other components within normal limits  COMPREHENSIVE METABOLIC PANEL - Abnormal; Notable for the following components:   Potassium 2.9 (*)    CO2 21 (*)    Glucose, Bld 100 (*)    BUN 27 (*)    Calcium 8.3 (*)    Total Protein 6.1 (*)    Albumin 3.3 (*)    All other components within normal limits  CBC WITH DIFFERENTIAL/PLATELET - Abnormal; Notable for the following components:   WBC 10.8 (*)    Hemoglobin 11.5 (*)    HCT 35.9 (*)    Platelets 94 (*)    Neutro Abs 9.1 (*)    All other components within normal limits    EKG EKG Interpretation  Date/Time:  Thursday October 18 2022 11:28:24 EST Ventricular Rate:  58 PR Interval:  193 QRS Duration: 101 QT Interval:  471 QTC Calculation: 463 R Axis:   50 Text Interpretation: Sinus rhythm Nonspecific T abnormalities, lateral leads  Confirmed by Davonna Belling [id number]) on 10/18/2022 12:37:10 PM  Radiology DG Wrist Complete Left  Result Date: 10/18/2022 CLINICAL DATA:  Wrist pain status post fall. EXAM: LEFT WRIST - COMPLETE 3+ VIEW COMPARISON:  Left hand radiographs 03/03/2018. FINDINGS: The bones are diffusely demineralized. There is prominent dorsal soft tissue swelling on the lateral view. Transverse linear sclerosis at the scaphoid neck appears similar to prior hand radiographs. No acute displaced fracture or dislocation identified. Chondrocalcinosis of the triangular fibrocartilage noted. There are mild degenerative changes at the 1st carpometacarpal and 1st interphalangeal joints. IMPRESSION: 1. Prominent dorsal soft tissue swelling with no acute displaced fracture or dislocation identified. Scaphoid transverse sclerosis appears similar to previous study. The swelling suggests a possible occult fracture. Consider CT for further evaluation. 2. Diffuse osseous demineralization and degenerative changes as described. Electronically Signed   By: Richardean Sale M.D.   On: 10/18/2022 13:01   DG Hip Unilat W or Wo Pelvis 2-3 Views Left  Result Date: 10/18/2022 CLINICAL DATA:  Left hip pain after fall. EXAM: DG HIP (WITH OR WITHOUT PELVIS) 2-3V LEFT COMPARISON:  None Available. FINDINGS: There is no evidence of hip fracture or dislocation. There is no evidence of arthropathy or other focal bone abnormality. IMPRESSION: Negative. Electronically Signed   By: Marijo Conception M.D.   On: 10/18/2022 12:58   CT HEAD WO CONTRAST (5MM)  Result Date: 10/18/2022 CLINICAL DATA:  Trauma EXAM: CT HEAD WITHOUT CONTRAST TECHNIQUE: Contiguous axial images were obtained from the base of the skull through the vertex without intravenous contrast. RADIATION DOSE REDUCTION: This exam was performed according to the departmental dose-optimization program which includes automated exposure control, adjustment of the mA and/or kV according to patient size  and/or use of iterative reconstruction technique. COMPARISON:  None Available. FINDINGS: Limitations: Assessment is slightly limited due to motion artifact. Brain: No evidence of acute infarction, hemorrhage, hydrocephalus, extra-axial collection or mass lesion/mass effect. Sequela of moderate chronic microvascular ischemic change with generalized volume loss. Vascular: No hyperdense vessel or unexpected calcification. Skull: Normal. Negative for fracture or focal lesion. Likely chronic fracture of the nasal bone on the right. Sinuses/Orbits: Mastoid or middle ear effusion. Frothy secretions in the right sphenoid sinus. Paranasal sinuses are otherwise clear. Orbits are unremarkable. Other: None IMPRESSION: 1. No acute intracranial abnormality. 2. Frothy secretions in the right sphenoid sinus. Correlate for sinusitis. Electronically Signed   By: Marin Roberts M.D.   On: 10/18/2022 12:36    Procedures Procedures    Medications Ordered in ED Medications  amLODipine (NORVASC) tablet 5 mg (has  no administration in time range)  propranolol (INDERAL) tablet 10 mg (has no administration in time range)    ED Course/ Medical Decision Making/ A&P                             Medical Decision Making Amount and/or Complexity of Data Reviewed Labs: ordered. Radiology: ordered.   Patient brought in for potential fall and confusion.  Reported on the bathtub.  Complaining mild pain in left hip.  Also swelling on left wrist.  Will get x-ray imaging of both.  Will get head CT to evaluate for intracranial hemorrhage.  Will get basic blood work to evaluate for other causes of mental status change such as electrolyte abnormality.  Blood work reassuring.  Urinalysis does not show infection.  Head CT reassuring.  Left wrist had some mild swelling.  Potential acute on chronic fracture.  However discussed with the patient's daughter.  Unlikely to do with surgery at this time.  CT scan had been recommended if needed by  radiology however for right now we will mobilize and follow-up as an outpatient as needed.  Where patient has been having difficulty at home.  With the patient's dementia and generalized weakness she has had falls and has not been able to do the ADLs that she previously could do.  Lives alone at this time.  Patient's daughter lives nearby.  Patient has not been able to cook at home.  [place] today potentially attempting to bathe herself.  Will get home health involved with the hope of avoiding placement.  Home health has been arranged.  Will see her at the house.  Will give Velcro for wrist..  Mild supplementation for        Final Clinical Impression(s) / ED Diagnoses Final diagnoses:  Fall, initial encounter  Dementia, unspecified dementia severity, unspecified dementia type, unspecified whether behavioral, psychotic, or mood disturbance or anxiety ([place])  Sprain of left wrist, initial encounter    Rx / DC Orders ED Discharge Orders          [place]        10/18/22 1516    Face-to-face encounter (required for Medicare/Medicaid patients)       Comments: I Davonna Belling certify that this patient is under my care and that I, or a nurse practitioner or physician's assistant working with me, had a face-to-face encounter that meets the physician face-to-face encounter requirements with this patient on 10/18/2022. The encounter with the patient was in whole, or in part for the following medical condition(s) which is the primary reason for home health care (List medical condition): dementia   10/18/22 1516    potassium chloride (KLOR-CON) 10 MEQ tablet  Daily        10/18/22 1608              Davonna Belling, MD 10/18/22 1609

## 2022-10-18 NOTE — ED Notes (Signed)
Pure wick placed.

## 2022-10-18 NOTE — ED Notes (Signed)
R wrist skin tear cleaned and wrap.

## 2022-10-18 NOTE — Progress Notes (Signed)
At this time this CSW has reached out to 7 home health facilities, [facility], [facility], [facility], [facility], [facility]. At this time [facility] and [facility] have declined that patient at this time due to insurance carrier. CSW will continue to reach out to places to set something up.

## 2022-10-18 NOTE — Progress Notes (Unsigned)
[facility] has reached out and agreed to take the patient. CSW will reach out to the family to inform them.

## 2022-10-18 NOTE — Discharge Instructions (Addendum)
Oh always [place]    Social Worker has reffered the family to a place for mom.   Social work will set up home health.   There is potentially a hidden break in the wrist.  Follow-up if symptoms do not improve.

## 2022-10-18 NOTE — Progress Notes (Signed)
Transition of Care [facility]) - Emergency Department Mini Assessment   Patient Details  Name: Brianna Carr MRN: [id number] Date of Birth: [birth year]  [facility] CM/SW Contact:    Rodney Booze, LCSW Phone Number: 10/18/2022, 3:24 PM   Clinical Narrative: CSW met the patient's daughter at bedside. The daughter has reported that she found the mom in the tub. The daughter reported that the mom has been really irritated as well out of it. The daughter does not want to put the mom in a home would like to have some private care. The CSW will set up home health as well give her a referral to a place for mom. The daughter has agreed that she will take whatever resources. The daughter also stated that they are working with the PCP to get extra support. TOC will follow DC for this patient.    ED Mini Assessment: What brought you to the Emergency Department? : (P) Patient's daughter brung her in found her in the bathtub.  Barriers to Discharge: (P) No Barriers Identified  Barrier interventions: (P) N/A needs Home health  Means of departure: (P) Car  Interventions which prevented an admission or readmission: (P) [place] or Services    Patient Contact and Communications     Spoke with: (P) Daughter Contact Date: (P) 10/18/22,     Contact Phone Number: (P) [phone]    Patient states their goals for this hospitalization and ongoing recovery are:: (P) Wants mom to have some care at home      Admission diagnosis:  possible fall Patient Active Problem List   Diagnosis Date Noted   Malnutrition of moderate degree 12/06/2021   Rhabdomyolysis 12/05/2021   AKI (acute kidney injury) ([place]) 12/05/2021   COVID-19 12/03/2021   Dementia without behavioral disturbance ([place]) 12/03/2021   Diabetes mellitus without complication ([place]) 79/10/4095   Hypothyroidism (acquired) 12/03/2021   Essential hypertension 12/03/2021   Chronic kidney disease, stage 3b ([place])  12/03/2021   Dyslipidemia 12/03/2021   PCP:  Deon Pilling, NP Pharmacy:   [facility] [id number], [place] [address] Phone: [phone] Fax: [phone]  [facility] [address] [place] [address] Phone: [phone] Fax: [phone]

## 2022-10-18 NOTE — ED Triage Notes (Signed)
Arrives via EMS from home after being found fallen in the bathtub, appears to family to be more altered than normal. Left hip pain, pt unaware of how she got in the tub, hx of dementia 170/88 BP 60 HR 128 cbg

## 2022-10-18 NOTE — ED Notes (Signed)
Patient transported to CT 

## 2022-11-03 IMAGING — DX DG CHEST 1V PORT
1 series · 1 of 1 positions shown · non-contrast
Comparison: Chest x-ray 07/07/2009

CLINICAL DATA: cough

EXAM:
PORTABLE CHEST 1 VIEW

[chest]
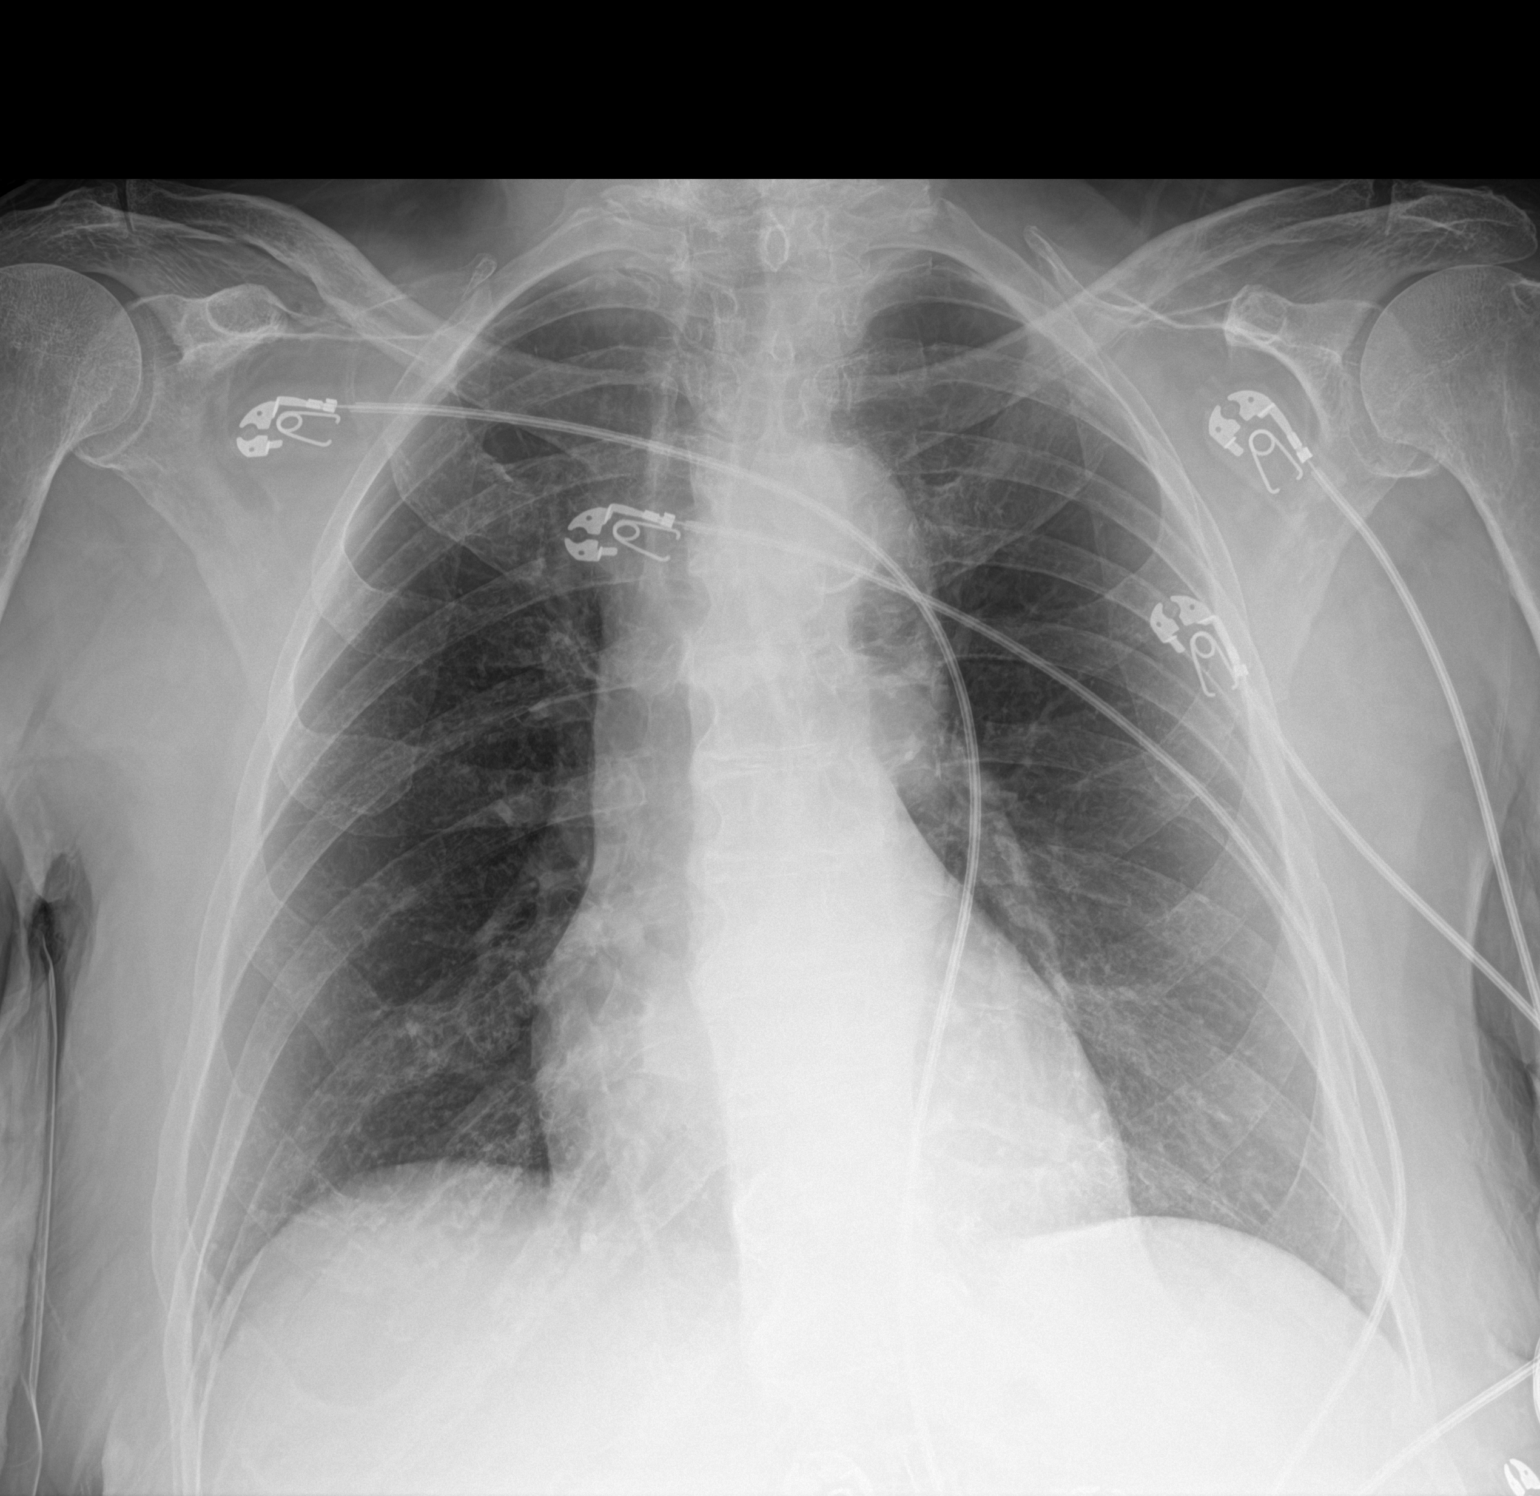

[1 of 1 positions shown; findings below may reference images not displayed]

FINDINGS: The heart and mediastinal contours are unchanged. Aortic
calcification.

No focal consolidation. No pulmonary edema. No pleural effusion. No
pneumothorax.

No acute osseous abnormality.
IMPRESSION: No active disease.

## 2022-12-10 ENCOUNTER — Encounter: Payer: Self-pay | Admitting: Neurology

## 2022-12-10 ENCOUNTER — Ambulatory Visit: Payer: Medicare Other | Admitting: Neurology

## 2023-04-20 ENCOUNTER — Emergency Department (HOSPITAL_COMMUNITY)
Admission: EM | Admit: 2023-04-20 | Discharge: 2023-04-20 | Disposition: A | Discharge status: Home / Self Care | Payer: Medicare Other | Attending: Emergency Medicine | Admitting: Emergency Medicine

## 2023-04-20 ENCOUNTER — Emergency Department (HOSPITAL_COMMUNITY): Payer: Medicare Other

## 2023-04-20 ENCOUNTER — Other Ambulatory Visit: Payer: Self-pay

## 2023-04-20 ENCOUNTER — Encounter (HOSPITAL_COMMUNITY): Payer: Self-pay

## 2023-04-20 DIAGNOSIS — F0280 Dementia in other diseases classified elsewhere without behavioral disturbance: Secondary | ICD-10-CM | POA: Insufficient documentation

## 2023-04-20 DIAGNOSIS — S51012A Laceration without foreign body of left elbow, initial encounter: Secondary | ICD-10-CM | POA: Diagnosis not present

## 2023-04-20 DIAGNOSIS — E119 Type 2 diabetes mellitus without complications: Secondary | ICD-10-CM | POA: Insufficient documentation

## 2023-04-20 DIAGNOSIS — Y92002 Bathroom of unspecified non-institutional (private) residence single-family (private) house as the place of occurrence of the external cause: Secondary | ICD-10-CM | POA: Insufficient documentation

## 2023-04-20 DIAGNOSIS — Z23 Encounter for immunization: Secondary | ICD-10-CM | POA: Insufficient documentation

## 2023-04-20 DIAGNOSIS — W1839XA Other fall on same level, initial encounter: Secondary | ICD-10-CM | POA: Diagnosis not present

## 2023-04-20 DIAGNOSIS — Z79899 Other long term (current) drug therapy: Secondary | ICD-10-CM | POA: Insufficient documentation

## 2023-04-20 DIAGNOSIS — Z7984 Long term (current) use of oral hypoglycemic drugs: Secondary | ICD-10-CM | POA: Diagnosis not present

## 2023-04-20 DIAGNOSIS — S0083XA Contusion of other part of head, initial encounter: Secondary | ICD-10-CM | POA: Diagnosis not present

## 2023-04-20 DIAGNOSIS — E039 Hypothyroidism, unspecified: Secondary | ICD-10-CM | POA: Insufficient documentation

## 2023-04-20 DIAGNOSIS — W19XXXA Unspecified fall, initial encounter: Secondary | ICD-10-CM

## 2023-04-20 DIAGNOSIS — I10 Essential (primary) hypertension: Secondary | ICD-10-CM | POA: Diagnosis not present

## 2023-04-20 MED ORDER — ACETAMINOPHEN 500 MG PO TABS: 500.0000 mg | ORAL_TABLET | Freq: Four times a day (QID) | ORAL | 0 refills | Status: AC | PRN

## 2023-04-20 MED ORDER — TETANUS-DIPHTH-ACELL PERTUSSIS 5-2.5-18.5 LF-MCG/0.5 IM SUSY
0.5000 mL | PREFILLED_SYRINGE | Freq: Once | INTRAMUSCULAR | Status: AC
  Administered 2023-04-20: 0.5 mL via INTRAMUSCULAR
  Filled 2023-04-20: qty 0.5

## 2023-04-20 NOTE — Discharge Instructions (Addendum)
You were seen today after a fall.  X-rays do not show any evidence of broken bones.  CT imaging is also reassuring.

## 2023-04-20 NOTE — ED Provider Notes (Signed)
[place] EMERGENCY DEPARTMENT AT [facility] Provider Note   CSN: [id number] Arrival date & time: 04/20/23  0415     History  Chief Complaint  Patient presents with   Fall    Patient arrived via ems from [facility] after fall in bathroom c-collar ems vs 176/80 72 18 98%ra cbg 169    DANASHIA BOHLEY is a 89 y.o. female.  HPI     This is a 89 year old female who presents following a fall.  Patient presents from [facility] by EMS.  Reportedly fell in the bathroom.  Patient does not remember getting up to go to the bathroom.  Does not know whether she lost consciousness.  States that she had some pain in the left arm and the left hip but "it does not hurt anymore."  She does not know whether she is on any blood thinners.  She is oriented to herself and place but not time.  Home Medications Prior to Admission medications   Medication Sig Start Date End Date Taking? Authorizing Provider  amLODipine (NORVASC) 5 MG tablet Take 1 tablet (5 mg total) by mouth daily. 07/24/22   Nahser, Deloris Ping, MD  bisoprolol (ZEBETA) 5 MG tablet Take 1 tablet (5 mg total) by mouth daily. 07/24/22   Nahser, Deloris Ping, MD  levothyroxine (SYNTHROID) 100 MCG tablet Take 100 mcg by mouth daily. 11/09/21   [provider]  metFORMIN (GLUCOPHAGE) 500 MG tablet Take 500 mg by mouth daily. Take 1 tablet by mouth daily with a meal 07/21/22   [provider]  potassium chloride (KLOR-CON) 10 MEQ tablet Take 1 tablet (10 mEq total) by mouth daily. 10/18/22   Benjiman Core, MD  propranolol (INDERAL) 10 MG tablet Take 1 tablet (10 mg total) by mouth 4 (four) times daily. 07/24/22   Nahser, Deloris Ping, MD      Allergies    Gabapentin, Hydrocodone-acetaminophen, Nitrofurantoin, Sulfamethoxazole-trimethoprim, and Sulfamethoxazole    Review of Systems   Review of Systems  Skin:  Positive for wound.  All other systems reviewed and are negative.   Physical Exam Updated Vital  Signs BP (!) 169/50   Pulse 67   Resp 11   Ht 1.575 m (5\' 2" )   Wt 60 kg   SpO2 99%   BMI 24.19 kg/m  Physical Exam Vitals and nursing note reviewed.  Constitutional:      Appearance: She is well-developed. She is not ill-appearing.  HENT:     Head: Normocephalic.     Comments: Bruising noted to the left face    Nose: Nose normal.  Eyes:     Pupils: Pupils are equal, round, and reactive to light.  Cardiovascular:     Rate and Rhythm: Normal rate and regular rhythm.     Heart sounds: Normal heart sounds.  Pulmonary:     Effort: Pulmonary effort is normal. No respiratory distress.     Breath sounds: No wheezing.  Abdominal:     Palpations: Abdomen is soft.     Tenderness: There is no abdominal tenderness.  Musculoskeletal:     Cervical back: Neck supple.     Comments: Normal range of motion left shoulder and elbow, there is a significant skin tear just distal to the elbow, no active bleeding, normal range of motion right hip, tenderness to palpation over the greater trochanter  Skin:    General: Skin is warm and dry.  Neurological:     Mental Status: She is alert.  Comments: Oriented to self and place  Psychiatric:        Mood and Affect: Mood normal.     ED Results / Procedures / Treatments   Labs (all labs ordered are listed, but only abnormal results are displayed) Labs Reviewed - No data to display  EKG EKG Interpretation Date/Time:  Saturday April 20 2023 04:22:28 EDT Ventricular Rate:  67 PR Interval:  187 QRS Duration:  99 QT Interval:  440 QTC Calculation: 465 R Axis:   40  Text Interpretation: Sinus rhythm Confirmed by Ross Marcus ([id number]) on 04/20/2023 5:45:56 AM  Radiology CT Cervical Spine Wo Contrast  Result Date: 04/20/2023 CLINICAL DATA:  89 year old female status post fall in bathroom. EXAM: CT CERVICAL SPINE WITHOUT CONTRAST TECHNIQUE: Multidetector CT imaging of the cervical spine was performed without intravenous contrast. Multiplanar  CT image reconstructions were also generated. RADIATION DOSE REDUCTION: This exam was performed according to the departmental dose-optimization program which includes automated exposure control, adjustment of the mA and/or kV according to patient size and/or use of iterative reconstruction technique. COMPARISON:  Head CT today.  Cervical spine CT 12/03/2021. FINDINGS: Alignment: Stable. Mild chronic straightening of cervical lordosis and degenerative appearing chronic anterolisthesis of C4 on C5, C7 on T1. Bilateral posterior element alignment is within normal limits. Skull base and vertebrae: Bone mineralization is within normal limits for age. Mild motion artifact at the skull base. Visualized skull base is intact. No atlanto-occipital dissociation. C1 and C2 appear chronically degenerated but intact and aligned. No acute osseous abnormality identified. Soft tissues and spinal canal: No prevertebral fluid or swelling. No visible canal hematoma. Calcified cervical carotid atherosclerosis worse on the right. Otherwise negative visible noncontrast neck soft tissues. 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 This includes moderate to severe C1-C2 degeneration (greater on the right), and multilevel cervical facet arthropathy with mild spondylolisthesis. Bulky chronic disc and endplate degeneration maximal at C5-C6 and C6-C7 with up to mild spinal stenosis at those levels. No significant change since 2023. Upper chest: Visible upper thoracic levels appear grossly intact. Lung apices are clear. Negative visible noncontrast thoracic inlet. IMPRESSION: 1. No acute traumatic injury identified in the cervical spine. 2. 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 3. Calcified cervical carotid atherosclerosis greater on the right. Electronically Signed   By: Odessa Fleming M.D.   On: 04/20/2023 05:41   DG  Elbow Complete Left  Result Date: 04/20/2023 CLINICAL DATA:  89 year old female status post fall in bathroom. EXAM: LEFT ELBOW - COMPLETE 3+ VIEW COMPARISON:  Report of left elbow series 07/05/2016 (no images available). FINDINGS: Bone mineralization is within normal limits for age. Maintained alignment and joint spaces at the elbow. No evidence of joint effusion. Proximal radius appears intact. No fracture or dislocation identified. IMPRESSION: No acute fracture or dislocation identified about the left elbow. Electronically Signed   By: Odessa Fleming M.D.   On: 04/20/2023 05:38   DG Hip Unilat W or Wo Pelvis 2-3 Views Left  Result Date: 04/20/2023 CLINICAL DATA:  89 year old female status post fall in bathroom. EXAM: DG HIP (WITH OR WITHOUT PELVIS) 2-3V LEFT COMPARISON:  Pelvis and left hip series 08/19/2023. FINDINGS: Two views are provided, AP view of the pelvis and also the left hip at 0459 hours. Femoral heads remain normally located. Bone mineralization is within normal limits for age. Pelvis appears stable and intact. Grossly intact proximal  femurs. Negative visible bowel gas pattern. IMPRESSION: No acute fracture or dislocation identified about the left hip or pelvis. If the patient is unable to weightbear recommend additional frog-leg lateral view of the left hip. Electronically Signed   By: Odessa Fleming M.D.   On: 04/20/2023 05:33   CT Head Wo Contrast  Result Date: 04/20/2023 CLINICAL DATA:  89 year old female status post fall in bathroom. EXAM: CT HEAD WITHOUT CONTRAST TECHNIQUE: Contiguous axial images were obtained from the base of the skull through the vertex without intravenous contrast. RADIATION DOSE REDUCTION: This exam was performed according to the departmental dose-optimization program which includes automated exposure control, adjustment of the mA and/or kV according to patient size and/or use of iterative reconstruction technique. COMPARISON:  Head CT 10/18/2022. FINDINGS: Brain: Cerebral  volume is within normal limits for age. No midline shift, ventriculomegaly, mass effect, evidence of mass lesion, intracranial hemorrhage or evidence of cortically based acute infarction. Patchy mild to moderate for age cerebral white matter hypodensity appears stable. Vascular: No suspicious intracranial vascular hyperdensity. Calcified atherosclerosis at the skull base. Skull: No acute osseous abnormality identified. Sinuses/Orbits: Visualized paranasal sinuses and mastoids are stable and well aerated. Other: No discrete orbit or scalp soft tissue injury identified. IMPRESSION: No acute intracranial abnormality or traumatic injury identified. Mild to moderate for age cerebral white matter changes of small vessel disease. Electronically Signed   By: Odessa Fleming M.D.   On: 04/20/2023 05:31    Procedures Procedures    Medications Ordered in ED Medications  Tdap (BOOSTRIX) injection 0.5 mL (0.5 mLs Intramuscular Given 04/20/23 0538)    ED Course/ Medical Decision Making/ A&P Clinical Course as of 04/20/23 0617  Sat Apr 20, 2023  0616 Spoke to patient's daughter.  Patient is at baseline.  She has been ambulatory. [CH]    Clinical Course User Index [CH] Kagan Hietpas, Mayer Masker, MD                             Medical Decision Making Amount and/or Complexity of Data Reviewed Radiology: ordered.  Risk Prescription drug management.   This patient presents to the ED for concern of fall, this involves an extensive number of treatment options, and is a complaint that carries with it a high risk of complications and morbidity.  I considered the following differential and admission for this acute, potentially life threatening condition.  The differential diagnosis includes head injury, neck injury, long bone fracture  MDM:    This is a 89 year old female who presents after a fall.  Patient states she does not remember much from the fall.  She reportedly was found in her bathroom.  She is overall nontoxic  and vital signs are notable for blood pressure 159/82.  She is not in any respiratory distress.  She has a contusion to the left side of her face and some pain over the left elbow and left hip.  CT head neck obtained and shows no evidence of acute fracture or intracranial injury.  No elbow or hip fractures noted.  She is able to ambulate at her baseline.  Daughter is now at the bedside.  She states that she is at her baseline.  EKG without any arrhythmic changes.  Do not feel she needs any further workup at this time.  No obvious significant traumatic injury.  (Labs, imaging, consults)  Labs: I Ordered, and personally interpreted labs.  The pertinent results include: None  Imaging  Studies ordered: I ordered imaging studies including CT head, cervical spine, x-ray left elbow, left hip I independently visualized and interpreted imaging. I agree with the radiologist interpretation  Additional history obtained from daughter, EMS.  External records from outside source obtained and reviewed including prior evaluations  Cardiac Monitoring: The patient was maintained on a cardiac monitor.  If on the cardiac monitor, I personally viewed and interpreted the cardiac monitored which showed an underlying rhythm of: Sinus rhythm  Reevaluation: After the interventions noted above, I reevaluated the patient and found that they have :improved  Social Determinants of Health:  lives in a living facility  Disposition: Discharge  Co morbidities that complicate the patient evaluation  Past Medical History:  Diagnosis Date   AKI (acute kidney injury) (HCC)    Dementia (HCC)    Diabetes mellitus without complication (HCC)    borderline diabetes   HTN (hypertension)    Hypothyroidism (acquired)      Medicines Meds ordered this encounter  Medications   Tdap (BOOSTRIX) injection 0.5 mL    I have reviewed the patients home medicines and have made adjustments as needed  Problem List / ED  Course: Problem List Items Addressed This Visit   None Visit Diagnoses     Fall, initial encounter    -  Primary   Contusion of face, initial encounter       Skin tear of left elbow without complication, initial encounter                       Final Clinical Impression(s) / ED Diagnoses Final diagnoses:  Fall, initial encounter  Contusion of face, initial encounter  Skin tear of left elbow without complication, initial encounter    Rx / DC Orders ED Discharge Orders     None         Virdie Penning, Mayer Masker, MD 04/20/23 [phone]

## 2023-04-20 NOTE — ED Triage Notes (Signed)
Fall in bathroom left elbow skin tears left face redness hematoma c/o lumbar/hip pain

## 2023-04-20 NOTE — ED Notes (Signed)
Report called to [facility]. Lennice Sites.

## 2023-04-20 NOTE — ED Notes (Signed)
Family at bedside. 

## 2023-04-20 NOTE — ED Notes (Signed)
Pt family to drive pt back to [facility]. Pt family verbalized understanding of discharge instructions. Staff at [facility] also contacted and verbalized understanding of discharge instructions. Pt wheeled from ed . Family to drive home

## 2023-04-20 NOTE — ED Notes (Signed)
Pt ambulated in room . Pt family reports this is baseline

## 2023-07-15 ENCOUNTER — Other Ambulatory Visit: Payer: Self-pay

## 2023-07-15 DIAGNOSIS — L989 Disorder of the skin and subcutaneous tissue, unspecified: Secondary | ICD-10-CM

## 2023-07-30 ENCOUNTER — Ambulatory Visit
Admission: RE | Admit: 2023-07-30 | Discharge: 2023-07-30 | Disposition: A | Discharge status: Home / Self Care | Payer: Medicare Other | Source: Ambulatory Visit

## 2023-07-30 ENCOUNTER — Other Ambulatory Visit: Payer: Self-pay

## 2023-07-30 DIAGNOSIS — L989 Disorder of the skin and subcutaneous tissue, unspecified: Secondary | ICD-10-CM

## 2024-02-05 ENCOUNTER — Other Ambulatory Visit (HOSPITAL_COMMUNITY): Payer: Self-pay
